# Patient Record
Sex: Female | Born: 1935 | Race: Asian | Hispanic: No | Smoking: Never smoker
Health system: Southern US, Community
[De-identification: ages and names within clinical notes are randomized; demographics above are authoritative.]

## PROBLEM LIST (undated history)

## (undated) DIAGNOSIS — F419 Anxiety disorder, unspecified: Secondary | ICD-10-CM

## (undated) DIAGNOSIS — K219 Gastro-esophageal reflux disease without esophagitis: Secondary | ICD-10-CM

## (undated) DIAGNOSIS — E7800 Pure hypercholesterolemia, unspecified: Secondary | ICD-10-CM

## (undated) DIAGNOSIS — E079 Disorder of thyroid, unspecified: Secondary | ICD-10-CM

## (undated) HISTORY — PX: KNEE ARTHROSCOPY: SUR90

## (undated) HISTORY — PX: ABDOMINAL HYSTERECTOMY: SHX81

---

## 2016-03-14 ENCOUNTER — Emergency Department (HOSPITAL_COMMUNITY)
Admission: EM | Admit: 2016-03-14 | Discharge: 2016-03-17 | Disposition: A | Discharge status: Other Acute Inpatient Hospital | Payer: Medicare Other | Attending: Emergency Medicine | Admitting: Emergency Medicine

## 2016-03-14 ENCOUNTER — Emergency Department (HOSPITAL_COMMUNITY): Payer: Medicare Other

## 2016-03-14 ENCOUNTER — Encounter (HOSPITAL_COMMUNITY): Payer: Self-pay

## 2016-03-14 DIAGNOSIS — F1110 Opioid abuse, uncomplicated: Secondary | ICD-10-CM | POA: Diagnosis not present

## 2016-03-14 DIAGNOSIS — Z8639 Personal history of other endocrine, nutritional and metabolic disease: Secondary | ICD-10-CM | POA: Insufficient documentation

## 2016-03-14 DIAGNOSIS — F4321 Adjustment disorder with depressed mood: Secondary | ICD-10-CM

## 2016-03-14 DIAGNOSIS — F4320 Adjustment disorder, unspecified: Secondary | ICD-10-CM | POA: Insufficient documentation

## 2016-03-14 DIAGNOSIS — Z8719 Personal history of other diseases of the digestive system: Secondary | ICD-10-CM | POA: Insufficient documentation

## 2016-03-14 DIAGNOSIS — F32A Depression, unspecified: Secondary | ICD-10-CM

## 2016-03-14 DIAGNOSIS — F329 Major depressive disorder, single episode, unspecified: Secondary | ICD-10-CM | POA: Insufficient documentation

## 2016-03-14 DIAGNOSIS — F419 Anxiety disorder, unspecified: Secondary | ICD-10-CM | POA: Diagnosis present

## 2016-03-14 HISTORY — DX: Disorder of thyroid, unspecified: E07.9

## 2016-03-14 HISTORY — DX: Gastro-esophageal reflux disease without esophagitis: K21.9

## 2016-03-14 HISTORY — DX: Pure hypercholesterolemia, unspecified: E78.00

## 2016-03-14 HISTORY — DX: Anxiety disorder, unspecified: F41.9

## 2016-03-14 LAB — COMPREHENSIVE METABOLIC PANEL
ALBUMIN: 3.5 g/dL (ref 3.5–5.0)
ALK PHOS: 44 U/L (ref 38–126)
ALT: 18 U/L (ref 14–54)
AST: 25 U/L (ref 15–41)
Anion gap: 11 (ref 5–15)
BUN: 13 mg/dL (ref 6–20)
CALCIUM: 8.8 mg/dL — AB (ref 8.9–10.3)
CO2: 22 mmol/L (ref 22–32)
Chloride: 100 mmol/L — ABNORMAL LOW (ref 101–111)
Creatinine, Ser: 0.7 mg/dL (ref 0.44–1.00)
GFR calc Af Amer: >60 mL/min (ref >60)
GFR calc non Af Amer: >60 mL/min (ref >60)
GLUCOSE: 119 mg/dL — AB (ref 65–99)
Potassium: 3.7 mmol/L (ref 3.5–5.1)
SODIUM: 133 mmol/L — AB (ref 135–145)
Total Bilirubin: 0.8 mg/dL (ref 0.3–1.2)
Total Protein: 6.7 g/dL (ref 6.5–8.1)

## 2016-03-14 LAB — RAPID URINE DRUG SCREEN, HOSP PERFORMED
Amphetamines: NOT DETECTED
Barbiturates: NOT DETECTED
Benzodiazepines: NOT DETECTED
COCAINE: NOT DETECTED
Opiates: POSITIVE — AB
Tetrahydrocannabinol: NOT DETECTED

## 2016-03-14 LAB — URINALYSIS, ROUTINE W REFLEX MICROSCOPIC
Bilirubin Urine: NEGATIVE
GLUCOSE, UA: NEGATIVE mg/dL
Hgb urine dipstick: NEGATIVE
KETONES UR: NEGATIVE mg/dL
Nitrite: NEGATIVE
PH: 6 (ref 5.0–8.0)
Protein, ur: NEGATIVE mg/dL
SPECIFIC GRAVITY, URINE: 1.008 (ref 1.005–1.030)

## 2016-03-14 LAB — CBC WITH DIFFERENTIAL/PLATELET
BASOS PCT: 0 %
Basophils Absolute: 0 10*3/uL (ref 0.0–0.1)
Eosinophils Absolute: 0 10*3/uL (ref 0.0–0.7)
Eosinophils Relative: 0 %
HEMATOCRIT: 33 % — AB (ref 36.0–46.0)
Hemoglobin: 11.1 g/dL — ABNORMAL LOW (ref 12.0–15.0)
Lymphocytes Relative: 30 %
Lymphs Abs: 1.4 10*3/uL (ref 0.7–4.0)
MCH: 33.3 pg (ref 26.0–34.0)
MCHC: 33.6 g/dL (ref 30.0–36.0)
MCV: 99.1 fL (ref 78.0–100.0)
MONO ABS: 0.3 10*3/uL (ref 0.1–1.0)
MONOS PCT: 7 %
NEUTROS ABS: 3 10*3/uL (ref 1.7–7.7)
Neutrophils Relative %: 63 %
Platelets: 266 10*3/uL (ref 150–400)
RBC: 3.33 MIL/uL — ABNORMAL LOW (ref 3.87–5.11)
RDW: 13.9 % (ref 11.5–15.5)
WBC: 4.8 10*3/uL (ref 4.0–10.5)

## 2016-03-14 LAB — I-STAT TROPONIN, ED: Troponin i, poc: 0.01 ng/mL (ref 0.00–0.08)

## 2016-03-14 LAB — URINE MICROSCOPIC-ADD ON: RBC / HPF: NONE SEEN RBC/hpf (ref 0–5)

## 2016-03-14 LAB — TSH: TSH: 9.393 u[IU]/mL — ABNORMAL HIGH (ref 0.350–4.500)

## 2016-03-14 MED ORDER — ZOLPIDEM TARTRATE 5 MG PO TABS
5.0000 mg | ORAL_TABLET | Freq: Every evening | ORAL | Status: DC | PRN
  Administered 2016-03-14 – 2016-03-16 (×3): 5 mg via ORAL
  Filled 2016-03-14 (×3): qty 1

## 2016-03-14 MED ORDER — LEVOTHYROXINE SODIUM 25 MCG PO TABS
25.0000 ug | ORAL_TABLET | Freq: Every day | ORAL | Status: DC
  Administered 2016-03-15 – 2016-03-17 (×3): 25 ug via ORAL
  Filled 2016-03-14 (×5): qty 1

## 2016-03-14 NOTE — ED Notes (Signed)
Patient here with grandaughter who reports that patient who lives alone has increased insomnia and anxiety since spouse died 5 months ago, currently lives alone

## 2016-03-14 NOTE — ED Notes (Signed)
Patient returned from X-ray 

## 2016-03-14 NOTE — BH Assessment (Addendum)
Tele Assessment Note   Deanna Cross is an 82 y.o. female. Pt presents accompanied by close supports [facility](Deanna Cross [phone]) & Deanna Cross [phone] (c), [phone] (h)). Pt consented verbally for exchange of information with supports.  Pt and supports report continued grief since pt's husband passed (12.2016). Pt has also been experiencing persistent episodes of intense fear, anxiety and what appears to be panic attacks from provided description. Pt reports no psychiatric history. Pt shared several times during interview that she wanted to die and did not want to live anymore. Pt expressed that she wished someone would kill her. Pt stated "If I had a gun I would shoot myself but I don't have one". Pt also shared thoughts of drowning herself. Pt denied intent.    Pt reported no HI or thoughts of harm towards other. Pt shared that she sometimes hears her husband's voice.   Pt reports Deanna NationsCindy Cross to be healthcare POA.  Diagnosis: F32.2 Major depressive disorder, single episode, severe w/ panic attacks   Past Medical History:  Past Medical History  Diagnosis Date  . Thyroid disease   . Anxiety   . High cholesterol   . GERD (gastroesophageal reflux disease)     History reviewed. No pertinent past surgical history.  Family History: No family history on file.  Social History:  reports that she has never smoked. She does not have any smokeless tobacco history on file. Her alcohol and drug histories are not on file.  Additional Social History:  Alcohol / Drug Use Pain Medications: See MAR Prescriptions: See MAR Over the Counter: None Reported History of alcohol / drug use?: No history of alcohol / drug abuse  CIWA: CIWA-Ar BP: 154/59 mmHg Pulse Rate: 71 COWS:    PATIENT STRENGTHS: (choose at least two) Average or above average intelligence Communication skills Supportive family/friends  Allergies: No Known Allergies  Home Medications:  (Not in a hospital  admission)  OB/GYN Status:  No LMP recorded.  General Assessment Data [facility] Assessment: In system Is this a Tele or Face-to-Face Assessment?: Tele Assessment Is this an Initial Assessment or a Re-assessment for this encounter?: Initial Assessment Marital status: Widowed Is patient pregnant?: No Pregnancy Status: No Living Arrangements: Alone Can pt return to current living arrangement?: Yes Admission Status: Voluntary Is patient capable of signing voluntary admission?: Yes Referral Source: Self/Family/Friend Insurance type: None     Crisis Care Plan Living Arrangements: Alone Name of Psychiatrist: None Name of Therapist: None  Education Status Is patient currently in school?: No Highest grade of school patient has completed: Not Reported  Risk to self with the past 6 months Suicidal Ideation: Yes-Currently Present Has patient been a risk to self within the past 6 months prior to admission? : Yes Suicidal Intent: No Has patient had any suicidal intent within the past 6 months prior to admission? : No Is patient at risk for suicide?: Yes Suicidal Plan?: Yes-Currently Present Has patient had any suicidal plan within the past 6 months prior to admission? : Yes Specify Current Suicidal Plan: "If I had a gun I could shoot myself", drown self Access to Means: Yes Specify Access to Suicidal Means: Access to water What has been your use of drugs/alcohol within the last 12 months?: None Reported Previous Attempts/Gestures: No Other Self Harm Risks: Grief Intentional Self Injurious Behavior: None Family Suicide History: No Recent stressful life event(s): Loss (Comment) (Husband passed 12/16) Persecutory voices/beliefs?: No Depression: Yes Depression Symptoms: Tearfulness, Insomnia, Despondent, Feeling worthless/self  pity, Loss of interest in usual pleasures Substance abuse history and/or treatment for substance abuse?: No Suicide prevention  information given to non-admitted patients: Not applicable  Risk to Others within the past 6 months Homicidal Ideation: No Does patient have any lifetime risk of violence toward others beyond the six months prior to admission? : No Thoughts of Harm to Others: No Current Homicidal Intent: No Current Homicidal Plan: No Access to Homicidal Means: No History of harm to others?: No Assessment of Violence: None Noted Does patient have access to weapons?: No Criminal Charges Pending?: No Does patient have a court date: No Is patient on probation?: No  Psychosis Hallucinations: None noted (reports hearing deceased husband's voice) Delusions: None noted  Mental Status Report Appearance/Hygiene: Unremarkable Eye Contact: Good Motor Activity: Unremarkable Speech: Logical/coherent Level of Consciousness: Alert Mood: Depressed Affect: Appropriate to circumstance, Depressed Anxiety Level: Moderate Thought Processes: Coherent, Relevant Judgement: Unimpaired Orientation: Person, Place, Time, Situation, Appropriate for developmental age Obsessive Compulsive Thoughts/Behaviors: None  Cognitive Functioning Concentration: Normal Memory: Recent Intact, Remote Intact IQ: Average Insight: Good Impulse Control: Good Appetite: Poor Weight Loss:  ("I lost a little bit") Weight Gain: 0 Sleep: Decreased Total Hours of Sleep:  (Pt reports unable to sleep) Vegetative Symptoms: None  ADLScreening [facility] Assessment Services) Patient's cognitive ability adequate to safely complete daily activities?: Yes Patient able to express need for assistance with ADLs?: Yes Independently performs ADLs?: No  Prior Inpatient Therapy Prior Inpatient Therapy: No  Prior Outpatient Therapy Prior Outpatient Therapy: No Does patient have an ACCT team?: No Does patient have Intensive In-House Services?  : No Does patient have [facility] services? : No Does patient have P4CC services?: No  ADL Screening (condition  at time of admission) Patient's cognitive ability adequate to safely complete daily activities?: Yes Is the patient deaf or have difficulty hearing?: No Does the patient have difficulty seeing, even when wearing glasses/contacts?: No Does the patient have difficulty concentrating, remembering, or making decisions?: Yes Patient able to express need for assistance with ADLs?: Yes Does the patient have difficulty dressing or bathing?: No Independently performs ADLs?: No Communication: Independent Dressing (OT): Independent Grooming: Independent Feeding: Independent Bathing: Independent with device (comment) Public relations account executive) Toileting: Independent with device (comment) (Grab bars nstalled in home bathroom ) In/Out Bed: Independent Walks in Home: Independent with device (comment) (Utilizes Walker) Does the patient have difficulty walking or climbing stairs?: Yes (Utilizes walker & hand rails ) Weakness of Legs: None Weakness of Arms/Hands: None  Home Assistive Devices/Equipment Home Assistive Devices/Equipment: Raised toilet seat with rails, Walker (specify type), Grab bars around toilet, Eyeglasses  Therapy Consults (therapy consults require a physician order) PT Evaluation Needed: No OT Evalulation Needed: No SLP Evaluation Needed: No Abuse/Neglect Assessment (Assessment to be complete while patient is alone) Physical Abuse: Denies Verbal Abuse: Denies Sexual Abuse: Denies Exploitation of patient/patient's resources: Denies Self-Neglect: Denies Values / Beliefs Cultural Requests During Hospitalization: None Spiritual Requests During Hospitalization: None Consults Spiritual Care Consult Needed: No Social Work Consult Needed: No Merchant navy officer (For Healthcare) Does patient have an advance directive?: No Would patient like information on creating an advanced directive?: No - patient declined information    Additional Information 1:1 In Past 12 Months?: No CIRT Risk:  No Elopement Risk: No Does patient have medical clearance?: No     Disposition: Per Maryjean Morn, PA pt meets criteria for inpatient admission and is to be referred to gero-psych. TTS to seek placement. Pt RN Lequita Halt) has been informed of pt  disposition.  Disposition Initial Assessment Completed for this Encounter: Yes Disposition of Patient: Other dispositions Other disposition(s): Other (Comment) (Pending Recommendation)  Kye Silverstein J [place] 03/14/2016 9:27 PM

## 2016-03-14 NOTE — ED Notes (Signed)
MD Ray at bedside, notified of patients remark about if she had a gun she would shoot herself.

## 2016-03-14 NOTE — ED Notes (Signed)
Pt placed in paper scurbs and wanded.

## 2016-03-14 NOTE — ED Notes (Signed)
TTS complete 

## 2016-03-14 NOTE — ED Provider Notes (Signed)
CSN: [id number]     Arrival date & time 03/14/16  1534 History   First MD Initiated Contact with Patient 03/14/16 1714     Chief Complaint  Patient presents with  . Anxiety  . sleep disturbances      (Consider location/radiation/quality/duration/timing/severity/associated sxs/prior Treatment) HPI 82 y.o. Female with no significant  pmh presents today with increasing inability to sleep, anxiety, depression with SI since husband died last 2023-10-28.  Patient lives alone.  She has friends that check on her.  She has seen her pmd in [place] twice in past two weeks for same and was given ambien, then continued unable to sleep so started citalopram.  Continues depressed and worsening states when asked if she would harm self, "I would shoot myself if I had a gun."  Past Medical History  Diagnosis Date  . Thyroid disease   . Anxiety   . High cholesterol   . GERD (gastroesophageal reflux disease)    History reviewed. No pertinent past surgical history. No family history on file. Social History  Substance Use Topics  . Smoking status: Never Smoker   . Smokeless tobacco: None  . Alcohol Use: None   OB History    No data available     Review of Systems  All other systems reviewed and are negative.     Allergies  Review of patient's allergies indicates no known allergies.  Home Medications   Prior to Admission medications   Not on File   BP 200/68 mmHg  Pulse 76  Temp(Src) 98 F (36.7 C) (Oral)  Resp 16  Ht 5' (1.524 m)  Wt 44.651 kg  BMI 19.22 kg/m2  SpO2 98% Physical Exam  Constitutional: She is oriented to person, place, and time. She appears well-developed and well-nourished. No distress.  HENT:  Head: Normocephalic and atraumatic.  Right Ear: External ear normal.  Left Ear: External ear normal.  Nose: Nose normal.  Mouth/Throat: Oropharynx is clear and moist.  Eyes: Conjunctivae are normal. Pupils are equal, round, and reactive to light.  Neck: Normal range of  motion. Neck supple.  Cardiovascular: Normal rate, regular rhythm, normal heart sounds and intact distal pulses.   Pulmonary/Chest: Effort normal and breath sounds normal.  Abdominal: Soft. Bowel sounds are normal.  Musculoskeletal: Normal range of motion. She exhibits no edema.  Neurological: She is alert and oriented to person, place, and time. She has normal reflexes. She displays normal reflexes. No cranial nerve deficit. She exhibits normal muscle tone. Coordination normal.  Skin: Skin is warm and dry.  Psychiatric: She has a normal mood and affect.  Nursing note and vitals reviewed.   ED Course  Procedures (including critical care time) Labs Review Labs Reviewed  COMPREHENSIVE METABOLIC PANEL - Abnormal; Notable for the following:    Sodium 133 (*)    Chloride 100 (*)    Glucose, Bld 119 (*)    Calcium 8.8 (*)    All other components within normal limits  CBC WITH DIFFERENTIAL/PLATELET - Abnormal; Notable for the following:    RBC 3.33 (*)    Hemoglobin 11.1 (*)    HCT 33.0 (*)    All other components within normal limits  TSH  URINALYSIS, ROUTINE W REFLEX MICROSCOPIC (NOT AT [facility])  URINE RAPID DRUG SCREEN, HOSP PERFORMED  I-STAT TROPOININ, ED    Imaging Review No results found. I have personally reviewed and evaluated these images and lab results as part of my medical decision-making.   EKG Interpretation   Date/Time:  Saturday Mar 14 2016 15:50:45 EDT Ventricular Rate:  74 PR Interval:  156 QRS Duration: 76 QT Interval:  452 QTC Calculation: 501 R Axis:   -9 Text Interpretation:  Normal sinus rhythm Anterior infarct , age  undetermined Abnormal ECG Confirmed by Wai Litt MD, Duwayne HeckANIELLE [phone]([id number]) on  03/14/2016 5:33:54 PM      MDM   Final diagnoses:  Depression  Grief reaction    Elevated tsh-would plan to start synthroid at 50 mcg and follow up with primary care in two weeks. Mild anemia-f/u 9:46 PM tts evaluation pending  11:43 PM tts states patient to be  admitted to geriatric psych  Margarita Grizzleanielle Tyshawn Ciullo, MD 03/14/16 2344

## 2016-03-14 NOTE — ED Notes (Signed)
Pt belongings sent with family, pt kept glasses, dentures and walker all within reach of pt.

## 2016-03-15 MED ORDER — RALOXIFENE HCL 60 MG PO TABS
60.0000 mg | ORAL_TABLET | Freq: Every day | ORAL | Status: DC
  Administered 2016-03-15 – 2016-03-16 (×2): 60 mg via ORAL
  Filled 2016-03-15 (×4): qty 1

## 2016-03-15 MED ORDER — CALCIUM CARB-CHOLECALCIFEROL 600-800 MG-UNIT PO TABS: 1.0000 | ORAL_TABLET | Freq: Every day | ORAL | Status: DC

## 2016-03-15 MED ORDER — MOMETASONE FURO-FORMOTEROL FUM 200-5 MCG/ACT IN AERO
2.0000 | INHALATION_SPRAY | Freq: Two times a day (BID) | RESPIRATORY_TRACT | Status: DC
  Administered 2016-03-15 – 2016-03-17 (×4): 2 via RESPIRATORY_TRACT
  Filled 2016-03-15: qty 8.8

## 2016-03-15 MED ORDER — CITALOPRAM HYDROBROMIDE 10 MG PO TABS
20.0000 mg | ORAL_TABLET | Freq: Every day | ORAL | Status: DC
  Administered 2016-03-15 – 2016-03-16 (×2): 20 mg via ORAL
  Filled 2016-03-15 (×2): qty 2

## 2016-03-15 MED ORDER — PRAVASTATIN SODIUM 40 MG PO TABS
20.0000 mg | ORAL_TABLET | Freq: Every evening | ORAL | Status: DC
Start: 2016-03-15 — End: 2016-03-17
  Administered 2016-03-15 – 2016-03-16 (×2): 20 mg via ORAL
  Filled 2016-03-15 (×2): qty 1

## 2016-03-15 MED ORDER — CALCIUM CARBONATE-VITAMIN D 500-200 MG-UNIT PO TABS
1.0000 | ORAL_TABLET | Freq: Every day | ORAL | Status: DC
  Administered 2016-03-15 – 2016-03-17 (×3): 1 via ORAL
  Filled 2016-03-15 (×4): qty 1

## 2016-03-15 MED ORDER — ASPIRIN EC 81 MG PO TBEC
81.0000 mg | DELAYED_RELEASE_TABLET | Freq: Every day | ORAL | Status: DC
Start: 2016-03-15 — End: 2016-03-17
  Administered 2016-03-15 – 2016-03-16 (×2): 81 mg via ORAL
  Filled 2016-03-15: qty 1

## 2016-03-15 MED ORDER — DOCUSATE SODIUM 100 MG PO CAPS
100.0000 mg | ORAL_CAPSULE | Freq: Every morning | ORAL | Status: DC
  Administered 2016-03-15 – 2016-03-16 (×2): 100 mg via ORAL
  Filled 2016-03-15 (×2): qty 1

## 2016-03-15 MED ORDER — FE FUMARATE-B12-VIT C-FA-IFC PO CAPS
1.0000 | ORAL_CAPSULE | Freq: Every morning | ORAL | Status: DC
Start: 2016-03-15 — End: 2016-03-17
  Administered 2016-03-15 – 2016-03-16 (×2): 1 via ORAL
  Filled 2016-03-15 (×4): qty 1

## 2016-03-15 MED ORDER — PANTOPRAZOLE SODIUM 40 MG PO TBEC
40.0000 mg | DELAYED_RELEASE_TABLET | Freq: Every day | ORAL | Status: DC
  Administered 2016-03-15 – 2016-03-16 (×2): 40 mg via ORAL
  Filled 2016-03-15 (×2): qty 1

## 2016-03-15 MED ORDER — ZOLPIDEM TARTRATE 5 MG PO TABS
5.0000 mg | ORAL_TABLET | Freq: Once | ORAL | Status: AC
  Administered 2016-03-15: 5 mg via ORAL
  Filled 2016-03-15: qty 1

## 2016-03-15 NOTE — ED Notes (Signed)
Pt to shower - [phone] - sitter escorting via w/c.

## 2016-03-15 NOTE — BH Assessment (Signed)
[facility]

## 2016-03-15 NOTE — BH Assessment (Addendum)
Referral information for Geriatric Placement has been faxed to;    [facility]   [place]   [place]   [address]   [facility]

## 2016-03-15 NOTE — ED Notes (Signed)
Pt asked if she could have anything more to help her sleep, as the previous dose of ambien seems to have worn off now. Clinton SawyerNotified Campos, MD.

## 2016-03-15 NOTE — ED Notes (Signed)
[facility] called and advised pt is being placed on their waitlist.

## 2016-03-15 NOTE — ED Notes (Signed)
Pt wearing her eyeglasses. Walker in room.

## 2016-03-15 NOTE — ED Notes (Signed)
Kim from [facility] called to advise they received referral for pt.

## 2016-03-15 NOTE — ED Notes (Signed)
Pt's friends visiting x 3. Pt states she has no family left.

## 2016-03-15 NOTE — ED Notes (Signed)
Pt declined snack.  

## 2016-03-15 NOTE — ED Notes (Signed)
Pt's friends are leaving now. Requested to be notified when placement for pt is found.

## 2016-03-15 NOTE — ED Notes (Signed)
Pt did not want snack.

## 2016-03-15 NOTE — ED Notes (Signed)
Friends x 2 visiting w/pt.

## 2016-03-16 NOTE — ED Notes (Signed)
Pt asking fpr sleeping med

## 2016-03-16 NOTE — BH Assessment (Signed)
[facility] Assessment Progress [facility], Pt has been accepted by Dr. Zonia KiefStephens to bed 9441, and call report # is [id number]. The bed is ready any time, but pt must be IVC'sd of arrive with secure transport.

## 2016-03-16 NOTE — ED Notes (Signed)
Patient was given a snack and drink, and a regular diet ordered for lunch. 

## 2016-03-16 NOTE — Progress Notes (Signed)
Followed up on inpatient geriatric-psych referrals.  Left voicemails with [facility] and [place].   Pt under [facility] at: [facility]- possible [facility] later today [facility]- per Boneta LucksJenny, no beds today but taking referrals for expected beds tomorrow [facility]- per [place] Anar[facility], one possible geriatric bed opening after 5pm today [facility]- per Denny PeonErin (usually requires dementia dx for program but advised pt may be appropriate due to other presenting symptoms)  [facility]. [facility] at capacity, however they note that dx of dementia/Alzhiemer's is required for admission therefore advise would not be accepted.  Could not reach [facility].  Ilean SkillMeghan Jack Mineau, MSW, LCSW Clinical Social Work, Disposition  03/16/2016 [phone]

## 2016-03-16 NOTE — ED Notes (Signed)
Calm comfortable

## 2016-03-16 NOTE — ED Notes (Signed)
Visitors at the bedside.

## 2016-03-16 NOTE — Progress Notes (Signed)
Spoke with D[facility]Erin [facility]- clarified that pt does not need to be IVC'd for admission if pt is willing to be transported voluntarily and sign self in upon admission. Informed [facility].   Ilean SkillMeghan Renard Caperton, MSW, LCSW Clinical Social Work, Disposition  03/16/2016 [phone]

## 2016-03-16 NOTE — ED Notes (Signed)
The pt wants to be taken outside  She cannot [place]  Discussed with the pt

## 2016-03-17 NOTE — Progress Notes (Signed)
[facility] called to check on pt's status. Writer informed her pt is being prepared for voluntary transfer.

## 2016-03-17 NOTE — ED Notes (Signed)
[facility] and is in agreement w/plan. Requested for RN to notify her friends - [place].

## 2016-03-17 NOTE — ED Notes (Signed)
Left messages for the [facility] to return call.

## 2016-03-17 NOTE — ED Provider Notes (Signed)
Patient is evaluated for EMTALA (09:25). [facility] is [facility] by Dr. Andria MeuseStevens. The patient is alert and nontoxic. She has no complaints point. She has eaten a normal breakfast. Idol signs are stable. Heart is regular without rub murmur gallop. Lungs are clear without wheeze rhonchi rail. Abdomen is soft and nontender. Patient does not have peripheral edema. Her skin is warm and dry. She is alert and appropriate with normal conversation and following commands appropriately. Patient is assessed to be stable for transfer to further psychiatric management.  Arby BarretteMarcy Rylyn Zawistowski, MD 03/17/16 [phone]

## 2016-04-16 ENCOUNTER — Emergency Department (HOSPITAL_COMMUNITY): Payer: Medicare Other

## 2016-04-16 ENCOUNTER — Observation Stay (HOSPITAL_COMMUNITY)
Admission: EM | Admit: 2016-04-16 | Discharge: 2016-04-17 | Disposition: A | Discharge status: Skilled Nursing Facility | Payer: Medicare Other | Attending: Internal Medicine | Admitting: Internal Medicine

## 2016-04-16 ENCOUNTER — Encounter (HOSPITAL_COMMUNITY): Payer: Self-pay | Admitting: Emergency Medicine

## 2016-04-16 ENCOUNTER — Observation Stay (HOSPITAL_COMMUNITY): Payer: Medicare Other

## 2016-04-16 DIAGNOSIS — F329 Major depressive disorder, single episode, unspecified: Secondary | ICD-10-CM | POA: Insufficient documentation

## 2016-04-16 DIAGNOSIS — I639 Cerebral infarction, unspecified: Secondary | ICD-10-CM

## 2016-04-16 DIAGNOSIS — I672 Cerebral atherosclerosis: Secondary | ICD-10-CM | POA: Diagnosis not present

## 2016-04-16 DIAGNOSIS — Z8673 Personal history of transient ischemic attack (TIA), and cerebral infarction without residual deficits: Secondary | ICD-10-CM

## 2016-04-16 DIAGNOSIS — I951 Orthostatic hypotension: Secondary | ICD-10-CM | POA: Diagnosis not present

## 2016-04-16 DIAGNOSIS — N183 Chronic kidney disease, stage 3 (moderate): Secondary | ICD-10-CM | POA: Insufficient documentation

## 2016-04-16 DIAGNOSIS — I69349 Monoplegia of lower limb following cerebral infarction affecting unspecified side: Secondary | ICD-10-CM | POA: Diagnosis not present

## 2016-04-16 DIAGNOSIS — D649 Anemia, unspecified: Secondary | ICD-10-CM | POA: Diagnosis not present

## 2016-04-16 DIAGNOSIS — R29898 Other symptoms and signs involving the musculoskeletal system: Secondary | ICD-10-CM

## 2016-04-16 DIAGNOSIS — R531 Weakness: Secondary | ICD-10-CM | POA: Diagnosis not present

## 2016-04-16 DIAGNOSIS — E7800 Pure hypercholesterolemia, unspecified: Secondary | ICD-10-CM | POA: Insufficient documentation

## 2016-04-16 DIAGNOSIS — I10 Essential (primary) hypertension: Secondary | ICD-10-CM | POA: Insufficient documentation

## 2016-04-16 DIAGNOSIS — G4700 Insomnia, unspecified: Secondary | ICD-10-CM | POA: Diagnosis not present

## 2016-04-16 DIAGNOSIS — K219 Gastro-esophageal reflux disease without esophagitis: Secondary | ICD-10-CM | POA: Diagnosis not present

## 2016-04-16 DIAGNOSIS — Z7982 Long term (current) use of aspirin: Secondary | ICD-10-CM | POA: Diagnosis not present

## 2016-04-16 DIAGNOSIS — R262 Difficulty in walking, not elsewhere classified: Secondary | ICD-10-CM | POA: Diagnosis not present

## 2016-04-16 DIAGNOSIS — E039 Hypothyroidism, unspecified: Secondary | ICD-10-CM

## 2016-04-16 DIAGNOSIS — N179 Acute kidney failure, unspecified: Secondary | ICD-10-CM | POA: Diagnosis not present

## 2016-04-16 DIAGNOSIS — G459 Transient cerebral ischemic attack, unspecified: Secondary | ICD-10-CM | POA: Insufficient documentation

## 2016-04-16 DIAGNOSIS — I129 Hypertensive chronic kidney disease with stage 1 through stage 4 chronic kidney disease, or unspecified chronic kidney disease: Secondary | ICD-10-CM | POA: Diagnosis not present

## 2016-04-16 DIAGNOSIS — J069 Acute upper respiratory infection, unspecified: Secondary | ICD-10-CM

## 2016-04-16 DIAGNOSIS — F419 Anxiety disorder, unspecified: Secondary | ICD-10-CM | POA: Diagnosis not present

## 2016-04-16 LAB — RAPID URINE DRUG SCREEN, HOSP PERFORMED
AMPHETAMINES: NOT DETECTED
BARBITURATES: NOT DETECTED
BENZODIAZEPINES: NOT DETECTED
COCAINE: NOT DETECTED
Opiates: POSITIVE — AB
Tetrahydrocannabinol: NOT DETECTED

## 2016-04-16 LAB — URINALYSIS, ROUTINE W REFLEX MICROSCOPIC
BILIRUBIN URINE: NEGATIVE
GLUCOSE, UA: NEGATIVE mg/dL
HGB URINE DIPSTICK: NEGATIVE
Ketones, ur: NEGATIVE mg/dL
Leukocytes, UA: NEGATIVE
Nitrite: NEGATIVE
PROTEIN: NEGATIVE mg/dL
Specific Gravity, Urine: 1.013 (ref 1.005–1.030)
pH: 7.5 (ref 5.0–8.0)

## 2016-04-16 LAB — COMPREHENSIVE METABOLIC PANEL
ALK PHOS: 38 U/L (ref 38–126)
ALT: 16 U/L (ref 14–54)
AST: 20 U/L (ref 15–41)
Albumin: 3.2 g/dL — ABNORMAL LOW (ref 3.5–5.0)
Anion gap: 9 (ref 5–15)
BILIRUBIN TOTAL: 0.7 mg/dL (ref 0.3–1.2)
BUN: 19 mg/dL (ref 6–20)
CALCIUM: 8.7 mg/dL — AB (ref 8.9–10.3)
CO2: 25 mmol/L (ref 22–32)
CREATININE: 1.02 mg/dL — AB (ref 0.44–1.00)
Chloride: 101 mmol/L (ref 101–111)
GFR calc non Af Amer: 48 mL/min — ABNORMAL LOW (ref >60)
GFR, EST AFRICAN AMERICAN: 55 mL/min — AB (ref >60)
Glucose, Bld: 91 mg/dL (ref 65–99)
Potassium: 3.6 mmol/L (ref 3.5–5.1)
SODIUM: 135 mmol/L (ref 135–145)
TOTAL PROTEIN: 6.3 g/dL — AB (ref 6.5–8.1)

## 2016-04-16 LAB — I-STAT CHEM 8, ED
BUN: 22 mg/dL — AB (ref 6–20)
CALCIUM ION: 1.08 mmol/L — AB (ref 1.13–1.30)
CHLORIDE: 99 mmol/L — AB (ref 101–111)
CREATININE: 1 mg/dL (ref 0.44–1.00)
GLUCOSE: 86 mg/dL (ref 65–99)
HCT: 32 % — ABNORMAL LOW (ref 36.0–46.0)
Hemoglobin: 10.9 g/dL — ABNORMAL LOW (ref 12.0–15.0)
Potassium: 3.6 mmol/L (ref 3.5–5.1)
SODIUM: 135 mmol/L (ref 135–145)
TCO2: 25 mmol/L (ref 0–100)

## 2016-04-16 LAB — I-STAT TROPONIN, ED: Troponin i, poc: 0.01 ng/mL (ref 0.00–0.08)

## 2016-04-16 LAB — ETHANOL: Alcohol, Ethyl (B): <5 mg/dL (ref <5)

## 2016-04-16 LAB — DIFFERENTIAL
Basophils Absolute: 0 10*3/uL (ref 0.0–0.1)
Basophils Relative: 0 %
EOS PCT: 0 %
Eosinophils Absolute: 0 10*3/uL (ref 0.0–0.7)
LYMPHS ABS: 1.4 10*3/uL (ref 0.7–4.0)
LYMPHS PCT: 19 %
MONOS PCT: 7 %
Monocytes Absolute: 0.5 10*3/uL (ref 0.1–1.0)
NEUTROS ABS: 5.4 10*3/uL (ref 1.7–7.7)
Neutrophils Relative %: 74 %

## 2016-04-16 LAB — PROTIME-INR
INR: 1.2 (ref 0.00–1.49)
Prothrombin Time: 15.4 seconds — ABNORMAL HIGH (ref 11.6–15.2)

## 2016-04-16 LAB — CBC
HEMATOCRIT: 31.1 % — AB (ref 36.0–46.0)
HEMOGLOBIN: 10.3 g/dL — AB (ref 12.0–15.0)
MCH: 32 pg (ref 26.0–34.0)
MCHC: 33.1 g/dL (ref 30.0–36.0)
MCV: 96.6 fL (ref 78.0–100.0)
PLATELETS: 327 10*3/uL (ref 150–400)
RBC: 3.22 MIL/uL — AB (ref 3.87–5.11)
RDW: 13.6 % (ref 11.5–15.5)
WBC: 7.4 10*3/uL (ref 4.0–10.5)

## 2016-04-16 LAB — TSH: TSH: 9.139 u[IU]/mL — ABNORMAL HIGH (ref 0.350–4.500)

## 2016-04-16 LAB — SODIUM, URINE, RANDOM: Sodium, Ur: 126 mmol/L

## 2016-04-16 LAB — CREATININE, URINE, RANDOM: CREATININE, URINE: 39.68 mg/dL

## 2016-04-16 LAB — VITAMIN B12: Vitamin B-12: 910 pg/mL (ref 180–914)

## 2016-04-16 LAB — FERRITIN: Ferritin: 667 ng/mL — ABNORMAL HIGH (ref 11–307)

## 2016-04-16 LAB — APTT: aPTT: 25 seconds (ref 24–37)

## 2016-04-16 MED ORDER — ALBUTEROL SULFATE (2.5 MG/3ML) 0.083% IN NEBU: 2.5000 mg | INHALATION_SOLUTION | Freq: Four times a day (QID) | RESPIRATORY_TRACT | Status: DC | PRN

## 2016-04-16 MED ORDER — MOMETASONE FURO-FORMOTEROL FUM 200-5 MCG/ACT IN AERO
2.0000 | INHALATION_SPRAY | Freq: Two times a day (BID) | RESPIRATORY_TRACT | Status: DC
  Filled 2016-04-16: qty 8.8

## 2016-04-16 MED ORDER — CITALOPRAM HYDROBROMIDE 10 MG PO TABS
20.0000 mg | ORAL_TABLET | Freq: Every day | ORAL | Status: DC
  Administered 2016-04-17: 20 mg via ORAL
  Filled 2016-04-16: qty 2

## 2016-04-16 MED ORDER — LEVOTHYROXINE SODIUM 75 MCG PO TABS
75.0000 ug | ORAL_TABLET | Freq: Every day | ORAL | Status: DC
  Administered 2016-04-17: 75 ug via ORAL
  Filled 2016-04-16: qty 1

## 2016-04-16 MED ORDER — SODIUM CHLORIDE 0.9 % IV SOLN
INTRAVENOUS | Status: AC
  Administered 2016-04-16: 20:00:00 via INTRAVENOUS

## 2016-04-16 MED ORDER — ACETAMINOPHEN 650 MG RE SUPP: 650.0000 mg | Freq: Four times a day (QID) | RECTAL | Status: DC | PRN

## 2016-04-16 MED ORDER — ACETAMINOPHEN 325 MG PO TABS: 650.0000 mg | ORAL_TABLET | Freq: Four times a day (QID) | ORAL | Status: DC | PRN

## 2016-04-16 MED ORDER — DOCUSATE SODIUM 100 MG PO CAPS
100.0000 mg | ORAL_CAPSULE | Freq: Every morning | ORAL | Status: DC
  Administered 2016-04-17: 100 mg via ORAL
  Filled 2016-04-16: qty 1

## 2016-04-16 MED ORDER — ENOXAPARIN SODIUM 30 MG/0.3ML ~~LOC~~ SOLN
30.0000 mg | SUBCUTANEOUS | Status: DC
  Administered 2016-04-17: 30 mg via SUBCUTANEOUS
  Filled 2016-04-16 (×2): qty 0.3

## 2016-04-16 MED ORDER — ASPIRIN EC 81 MG PO TBEC
81.0000 mg | DELAYED_RELEASE_TABLET | Freq: Every day | ORAL | Status: DC
  Administered 2016-04-17: 81 mg via ORAL
  Filled 2016-04-16: qty 1

## 2016-04-16 MED ORDER — ENOXAPARIN SODIUM 40 MG/0.4ML ~~LOC~~ SOLN: 40.0000 mg | SUBCUTANEOUS | Status: DC

## 2016-04-16 MED ORDER — ASPIRIN 81 MG PO CHEW
CHEWABLE_TABLET | ORAL | Status: AC
  Administered 2016-04-16: 81 mg
  Filled 2016-04-16: qty 1

## 2016-04-16 MED ORDER — TRAZODONE HCL 50 MG PO TABS
50.0000 mg | ORAL_TABLET | Freq: Every evening | ORAL | Status: DC | PRN
  Administered 2016-04-16: 50 mg via ORAL
  Filled 2016-04-16: qty 1

## 2016-04-16 MED ORDER — SODIUM CHLORIDE 0.9 % IV BOLUS (SEPSIS)
1000.0000 mL | Freq: Once | INTRAVENOUS | Status: AC
  Administered 2016-04-16: 1000 mL via INTRAVENOUS

## 2016-04-16 MED ORDER — ALBUTEROL SULFATE HFA 108 (90 BASE) MCG/ACT IN AERS: 2.0000 | INHALATION_SPRAY | Freq: Four times a day (QID) | RESPIRATORY_TRACT | Status: DC | PRN

## 2016-04-16 NOTE — ED Notes (Signed)
Attempted report 

## 2016-04-16 NOTE — ED Notes (Addendum)
PT brought by EMS from home for speech changes. Family was concerned that PT may be experiencing stroke symptoms. PT was last seen 2 days ago by family and was normal at that time. Family and EMS describe speech changes as "stuttering". EMS describes speech as repetitive upon arrival. During transport speech improved. PT has had a previous stroke with lingering right sided deficits. When asked what brought PT in today she states, "I sat down in my chair and could not get up. I want to know why that happened." PT reports she experienced general weakness at that time, not unilateral weakness. PT also states, "I had a hard time getting into bed last night, but I made it."

## 2016-04-16 NOTE — ED Provider Notes (Signed)
CSN: [id number]     Arrival date & time 04/16/16  1006 History   First MD Initiated Contact with Patient 04/16/16 1026     Chief Complaint  Patient presents with  . Transient Ischemic Attack     (Consider location/radiation/quality/duration/timing/severity/associated sxs/prior Treatment) The history is provided by the patient.  Patient is a 82 yo F with a PMHx of stroke, hyothyroidism, anxiety, HLD, and GERD presenting to the ED by EMS from home for speech changes. As per EMS and family, patient's speech was "stuttering." EMS describes the speech as repetitive upon arrival. Speech improved during transport. Patient states she was feeling weak yesterday and having some difficulty getting up from her chair. States she was [facility] a week ago for cold symptoms and SOB which have since resolved. States this morning her symptoms started at around 6 am when she was feeling lightheaded. Patient states "I could not get out the chair because my whole body felt weak." She lives independently and called her friend to tell him about her symptoms. States she was having difficulty with her speech when talking on the phone as "it was all over." States her vision changed - "I looked at the pictueres on the wall and they were moving."  Reports having right leg weakness from prior stroke 5-6 years ago and right knee surgery. Reports taking a baby Aspirin everyday. Denies any history of seizures.     Past Medical History  Diagnosis Date  . Thyroid disease   . Anxiety   . High cholesterol   . GERD (gastroesophageal reflux disease)    Past Surgical History  Procedure Laterality Date  . Abdominal hysterectomy     No family history on file. Social History  Substance Use Topics  . Smoking status: Never Smoker   . Smokeless tobacco: None  . Alcohol Use: No   OB History    No data available     Review of Systems  Constitutional: Negative for fever and chills.  HENT: Negative for rhinorrhea  and sore throat.   Eyes: Positive for visual disturbance. Negative for pain.  Respiratory: Negative for cough, shortness of breath and wheezing.   Cardiovascular: Negative for chest pain and leg swelling.  Gastrointestinal: Negative for nausea, vomiting and abdominal pain.  Genitourinary: Negative for dysuria and flank pain.  Musculoskeletal: Negative for myalgias and neck pain.  Skin: Negative for pallor and rash.  Neurological: Positive for speech difficulty, weakness and light-headedness.      Allergies  Accolate; Aleve; Benadryl; Ibuprofen; Kenalog; Macrolides and ketolides; and Rocephin  Home Medications   Prior to Admission medications   Medication Sig Start Date End Date Taking? Authorizing Provider  ADVAIR DISKUS 250-50 MCG/DOSE AEPB Inhale 2 puffs into the lungs 2 (two) times daily. 03/14/16   Historical Provider, MD  aspirin EC 81 MG tablet Take 81 mg by mouth daily.    Historical Provider, MD  Calcium Carb-Cholecalciferol (CALTRATE 600+D) 600-800 MG-UNIT TABS Take 1 tablet by mouth daily.    Historical Provider, MD  citalopram (CELEXA) 20 MG tablet Take 20 mg by mouth daily. 03/12/16   Historical Provider, MD  docusate sodium (COLACE) 100 MG capsule Take 100 mg by mouth every morning.     Historical Provider, MD  levothyroxine (SYNTHROID, LEVOTHROID) 75 MCG tablet Take 75 mcg by mouth daily before breakfast. 02/17/16   Historical Provider, MD  mineral/vitamin supplement (MULTIGEN) 70 MG TABS tablet Take 1 tablet by mouth daily. 03/07/16   Historical Provider, MD  omeprazole (PRILOSEC) 20 MG capsule Take 20 mg by mouth 2 (two) times daily.  02/17/16   Historical Provider, MD  pravastatin (PRAVACHOL) 20 MG tablet Take 20 mg by mouth every evening. 02/17/16   Historical Provider, MD  raloxifene (EVISTA) 60 MG tablet Take 60 mg by mouth daily.    Historical Provider, MD  zolpidem (AMBIEN) 5 MG tablet Take 5 mg by mouth at bedtime.     Historical Provider, MD   BP 166/64 mmHg  Pulse 85   Temp(Src) 98.7 F (37.1 C) (Oral)  Resp 16  Ht 5\' 3"  (1.6 m)  Wt 48.081 kg  BMI 18.78 kg/m2  SpO2 100% Physical Exam  Constitutional: She is oriented to person, place, and time. She appears well-developed and well-nourished. No distress.  HENT:  Head: Normocephalic and atraumatic.  Mouth/Throat: Oropharynx is clear and moist.  Eyes: EOM are normal. Pupils are equal, round, and reactive to light.  Neck: Neck supple. No tracheal deviation present.  Cardiovascular: Normal rate, regular rhythm and intact distal pulses.  Exam reveals no gallop and no friction rub.   No murmur heard. Pulmonary/Chest: Effort normal and breath sounds normal. No respiratory distress. She has no wheezes. She has no rales.  Abdominal: Soft. Bowel sounds are normal. She exhibits no distension. There is no tenderness. There is no guarding.  Musculoskeletal: She exhibits no edema.  Neurological: She is alert and oriented to person, place, and time. No cranial nerve deficit.  Strength: 5/5 in bilateral upper extremities. 4/5 in RLE and 5/5 in LLE. Sensation to light touch intact throughout. No dysdiadochokinesia. Coordination normal.   Skin: Skin is warm and dry. No rash noted. No erythema.  Psychiatric: She has a normal mood and affect. Her behavior is normal.    ED Course  Procedures (including critical care time) Labs Review Labs Reviewed - No data to display  Imaging Review No results found. I have personally reviewed and evaluated these images and lab results as part of my medical decision-making.   EKG Interpretation None      MDM   Final diagnoses:  None   Generalized weakness Patient is presenting with acute onset, transient weakness in her entire body, lightheadedness, speech changes, and vision changes which have resolved. She has a history of prior stroke and residual RLE weakness. UDS positive for opiates. Ethanol negative. Troponin negative. CT of head showing advanced atrophy and mild  microvascular ischemic disease without acute intracranial process. MRI of head showing no acute infarct or intracranial hemorrhage. It is showing a remote small mid right coronal radiata infarct. Remote tiny right cerebellar infarct. Mild to slightly moderate chronic microvascular changes and mild global atrophy without hydrocephalus. MRA of head was motion degraded which limits evaluation for intracranial atherosclerotic changes and for detecting aneurysm. There is flow within portions of the internal carotid arteries, vertebral arteries, and basilar artery. Findings are suspicious for intracranial and atherosclerotic changes with medium and small vessel stenosis.  -Neurology has been consulted. Spoke to Dr. Amada JupiterKirkpatrick, he does not believe patient's symptoms are neurologic in etiology. However, he is recommending overnight inpatient observation for her generalized weakness.  -Spoke to [facility] and they are admitting the patient for observation under their service.    AKI SCr 1.02 today, was 0.7 on 03/14/16. Likely due to dehydration. -1L bolus of NS     John GiovanniVasundhra Azad Calame, MD 04/16/16 1707  Geoffery Lyonsouglas Delo, MD 04/17/16 1317

## 2016-04-16 NOTE — H&P (Signed)
Date: 04/16/2016               Patient Name:  Deanna Cross MRN: [id number]  DOB: Jun 01, 1926 Age / Sex: 82 y.o., female   PCP: No primary care provider on file.              Medical Service: Internal Medicine Teaching Service              Attending Physician: Dr. Inez CatalinaEmily B Mullen, MD    First Contact: Danelle Earthlyaryl Shawnelle Spoerl, MS 4 Pager: [phone]  Second Contact: Dr. Danella Pentonruong Pager: [phone]            After Hours (After 5p/  First Contact Pager: [phone]  weekends / holidays): Second Contact Pager: [phone]   Chief Complaint: weakness  History of Present Illness: Deanna Cross is an 82 year old woman with a history of hypothyroidism and CVA with right sided deficits that presents due to family concern for stroke.  Per witness, patient was tremulous, weak and disoriented this morning with increased dizziness. Patient only complains that she has difficulty chair this morning due to increased leg weakness. Denies chest pain, palpitations, shortness of breath, urinary incontinence, or recent falls.    Finishing up course of antibiotics (Levaquin) for URI that has persisted for the past 2 weeks. Treatment course complicated by an anaphylactic reaction to treatment and syncope one week ago.   Patient lives alone and able to perform ADLs.  Ambulates with a walker but often holds on to walls and tables to get around.  Recently started on lisinopril 5mg  for high blood pressure.  In ED, patient's weakness has improved with IV fluids.  CT and MRI negative for TIA.   Meds: Current Facility-Administered Medications  Medication Dose Route Frequency Provider Last Rate Last Dose  . 0.9 %  sodium chloride infusion   Intravenous Continuous Denton Brickiana M Truong, MD      . acetaminophen (TYLENOL) tablet 650 mg  650 mg Oral Q6H PRN Denton Brickiana M Truong, MD       Or  . acetaminophen (TYLENOL) suppository 650 mg  650 mg Rectal Q6H PRN Denton Brickiana M Truong, MD      . enoxaparin (LOVENOX) injection 40 mg  40 mg Subcutaneous Q24H Denton Brickiana M Truong,  MD       Current Outpatient Prescriptions  Medication Sig Dispense Refill  . ADVAIR DISKUS 250-50 MCG/DOSE AEPB Inhale 1 puff into the lungs 2 (two) times daily.     Marland Kitchen. aspirin EC 81 MG tablet Take 81 mg by mouth daily.    . Calcium Carb-Cholecalciferol (CALTRATE 600+D) 600-800 MG-UNIT TABS Take 1 tablet by mouth daily.    . citalopram (CELEXA) 20 MG tablet Take 20 mg by mouth daily.    Marland Kitchen. docusate sodium (COLACE) 100 MG capsule Take 100 mg by mouth every morning.     Marland Kitchen. levofloxacin (LEVAQUIN) 750 MG tablet Take 750 mg by mouth daily.    Marland Kitchen. levothyroxine (SYNTHROID, LEVOTHROID) 75 MCG tablet Take 75 mcg by mouth daily before breakfast.    . lisinopril (PRINIVIL,ZESTRIL) 5 MG tablet Take 5 mg by mouth every morning.    . mineral/vitamin supplement (MULTIGEN) 70 MG TABS tablet Take 1 tablet by mouth daily.    Marland Kitchen. omeprazole (PRILOSEC) 20 MG capsule Take 20 mg by mouth 2 (two) times daily.     . pravastatin (PRAVACHOL) 20 MG tablet Take 20 mg by mouth every evening.    Marland Kitchen. PROAIR HFA 108 (90 Base) MCG/ACT inhaler Inhale 2 puffs  into the lungs every 6 (six) hours as needed for wheezing or shortness of breath.     . traZODone (DESYREL) 50 MG tablet Take 50 mg by mouth at bedtime as needed for sleep.    . raloxifene (EVISTA) 60 MG tablet Take 60 mg by mouth daily.      Allergies: Allergies as of 04/16/2016 - Review Complete 04/16/2016  Allergen Reaction Noted  . Kenalog [triamcinolone acetonide] Anaphylaxis 04/16/2016  . Rocephin [ceftriaxone sodium in dextrose] Anaphylaxis 04/16/2016  . Accolate [zafirlukast] Other (See Comments) 04/16/2016  . Aleve [naproxen sodium] Nausea And Vomiting 04/16/2016  . Benadryl [diphenhydramine] Nausea And Vomiting 04/16/2016  . Ibuprofen Nausea And Vomiting 04/16/2016  . Macrolides and ketolides Other (See Comments) 04/16/2016   Past Medical History  Diagnosis Date  . Thyroid disease   . Anxiety   . High cholesterol   . GERD (gastroesophageal reflux disease)      Past Surgical History  Procedure Laterality Date  . Abdominal hysterectomy    . Knee arthroscopy      rt knee   Family History  Problem Relation Age of Onset  . Hypertension Mother   . Hypertension Father    Social History   Social History  . Marital Status: Widowed    Spouse Name: N/A  . Number of Children: N/A  . Years of Education: N/A   Occupational History  . Not on file.   Social History Main Topics  . Smoking status: Never Smoker   . Smokeless tobacco: Not on file  . Alcohol Use: No  . Drug Use: No  . Sexual Activity: Not on file   Other Topics Concern  . Not on file   Social History Narrative    Review of Systems: Constitutional: positive for fatigue, weakness, and dizziness Eyes: negative for visual disturbances Respiratory: negative for dyspnea on exertion Cardiovascular: negative for chest pain, chest pressure/discomfort and palpitations Gastrointestinal: negative for abdominal pain, constipation and diarrhea Genitourinary:negative for decreased stream, dysuria and frequency Musculoskeletal: positive for muscle weakness Behavioral/Psych: negative for depression, suicidal ideation, hallucinations   Physical Exam: Blood pressure 150/62, pulse 66, temperature 98.7 F (37.1 C), temperature source Oral, resp. rate 12, height 5\' 3"  (1.6 m), weight 48.081 kg (106 lb), SpO2 96 %. BP 150/62 mmHg  Pulse 66  Temp(Src) 98.7 F (37.1 C) (Oral)  Resp 12  Ht 5\' 3"  (1.6 m)  Wt 48.081 kg (106 lb)  BMI 18.78 kg/m2  SpO2 96%  General Appearance:    Alert, cooperative, no distress, laying in hospital bed  Head:    Normocephalic, without obvious abnormality, atraumatic  Eyes:    PERRL, conjunctiva/corneas clear, EOM's intact  Throat:   Oral mucosa dry  Neck:   Supple, symmetrical, trachea midline, no adenopathy;    thyroid:  no enlargement/tenderness/nodules; no carotid   bruit or JVD  Back:     Symmetric, no curvature, ROM normal, no CVA tenderness   Lungs:     Clear to auscultation bilaterally, respirations unlabored   Heart:    Regular rate and rhythm, S1 and S2 normal, no murmur, rub   or gallop  Breast Exam:    No tenderness, masses, or nipple abnormality  Abdomen:     Soft, non-tender, bowel sounds active all four quadrants,    no masses, no organomegaly  Extremities:   Extremities normal, atraumatic, no cyanosis or edema  Pulses:   2+ and symmetric all extremities  Skin:   Skin color, texture, turgor normal, no rashes  or lesions  Neurologic:   CNII-XII intact except for right lower facial droop, 4/5 strength in right lower extremity,  5/5 strength in bilateral upper extremities and left lower extremity, normal finger to nose test    Lab results: Basic Metabolic Panel:  Recent Labs  29/56/21 1048 04/16/16 1101  NA 135 135  K 3.6 3.6  CL 101 99*  CO2 25  --   GLUCOSE 91 86  BUN 19 22*  CREATININE 1.02* 1.00  CALCIUM 8.7*  --    Liver Function Tests:  Recent Labs  04/16/16 1048  AST 20  ALT 16  ALKPHOS 38  BILITOT 0.7  PROT 6.3*  ALBUMIN 3.2*   No results for input(s): LIPASE, AMYLASE in the last 72 hours. No results for input(s): AMMONIA in the last 72 hours. CBC:  Recent Labs  04/16/16 1048 04/16/16 1101  WBC 7.4  --   NEUTROABS 5.4  --   HGB 10.3* 10.9*  HCT 31.1* 32.0*  MCV 96.6  --   PLT 327  --    Coagulation:  Recent Labs  04/16/16 1048  LABPROT 15.4*  INR 1.20   Urine Drug Screen: Drugs of Abuse     Component Value Date/Time   LABOPIA POSITIVE* 04/16/2016 1208   COCAINSCRNUR NONE DETECTED 04/16/2016 1208   LABBENZ NONE DETECTED 04/16/2016 1208   AMPHETMU NONE DETECTED 04/16/2016 1208   THCU NONE DETECTED 04/16/2016 1208   LABBARB NONE DETECTED 04/16/2016 1208    Alcohol Level:  Recent Labs  04/16/16 1048  ETH <5   Urinalysis:  Recent Labs  04/16/16 1208  COLORURINE YELLOW  LABSPEC 1.013  PHURINE 7.5  GLUCOSEU NEGATIVE  HGBUR NEGATIVE  BILIRUBINUR NEGATIVE   KETONESUR NEGATIVE  PROTEINUR NEGATIVE  NITRITE NEGATIVE  LEUKOCYTESUR NEGATIVE   Imaging results:  Ct Head Wo Contrast  04/16/2016  CLINICAL DATA:  Allergic reaction in syncopal episode approximately 1 week ago. Concern for TIA. EXAM: CT HEAD WITHOUT CONTRAST TECHNIQUE: Contiguous axial images were obtained from the base of the skull through the vertex without intravenous contrast. COMPARISON:  04/09/2016 FINDINGS: Brain: Similar findings of advanced atrophy with sulcal prominence centralized volume loss with commensurate ex vacuo dilatation of the ventricular system. Extensive periventricular hypodensities compatible microvascular ischemic disease. Old lacunar infarct within the bilateral insular cortices, right greater left (representative image 11, series 2). Given extensive background parenchymal abnormalities, there is no CT evidence of superimposed acute large territory infarct. No intraparenchymal or extra-axial mass or hemorrhage. Unchanged size and configuration of the ventricles and basilar cisterns. No midline shift. Vascular: Intracranial atherosclerosis. Skull: Negative for fracture or focal lesion. Sinuses/Orbits: Minimally mucosal thickening involving the bilateral sphenoid sinuses. The remaining paranasal sinuses and mastoid air cells are normally aerated. No air-fluid levels. Other: Regional soft tissues appear normal. IMPRESSION: Similar findings of advanced atrophy and mild microvascular ischemic disease without acute intracranial process. Electronically Signed   By: Simonne Come M.D.   On: 04/16/2016 11:30   Mr Shirlee Latch Wo Contrast  04/16/2016  CLINICAL DATA:  82 year old female with depression and altered mental status. Weakness. Syncopal episode 1 week ago. Subsequent encounter. EXAM: MRI HEAD WITHOUT CONTRAST MRA HEAD WITHOUT CONTRAST TECHNIQUE: Multiplanar, multiecho pulse sequences of the brain and surrounding structures were obtained without intravenous contrast. Angiographic  images of the head were obtained using MRA technique without contrast. COMPARISON:  04/16/2016 head CT. FINDINGS: MRI HEAD FINDINGS No acute infarct or intracranial hemorrhage. Remote small mid right coronal radiata infarct. Remote tiny right  cerebellar infarct. Mild to slightly moderate chronic microvascular changes. Incidentally noted are prominent perivascular spaces in the basal ganglia. Mild global atrophy without hydrocephalus. No intracranial mass lesion noted on this unenhanced exam. Post lens replacement otherwise orbital structures unremarkable. Cervical medullary junction within normal limits. Straightening of the cervical spine with mild degenerative changes without high-grade stenosis. Partially empty non expanded sella incidentally noted. Minimal mucosal thickening ethmoid sinus air cells, right maxillary sinus and right sphenoid sinus. MRA HEAD FINDINGS Exam is motion degraded. This limits evaluation for evaluating intracranial atherosclerotic type changes and for detecting aneurysm. All that can be stated with certainty on the present examination is that there is flow within portions of the internal carotid arteries, vertebral arteries and basilar artery. Findings are suspicious for intracranial atherosclerotic changes with medium and small size vessel stenosis. Bulge left lateral aspect of the basilar tip may be related to artifact/atherosclerotic type changes rather than aneurysm. IMPRESSION: MRI HEAD No acute infarct or intracranial hemorrhage. Remote small mid right coronal radiata infarct. Remote tiny right cerebellar infarct. Mild to slightly moderate chronic microvascular changes. Mild global atrophy without hydrocephalus. No intracranial mass lesion noted on this unenhanced exam. MRA HEAD Exam is motion degraded. This limits evaluation for evaluating intracranial atherosclerotic type changes and for detecting aneurysm. All that can be stated with certainty on the present examination is that  there is flow within portions of the internal carotid arteries, vertebral arteries and basilar artery. Findings are suspicious for intracranial atherosclerotic changes with medium and small size vessel stenosis. Electronically Signed   By: Lacy Duverney M.D.   On: 04/16/2016 15:37   Mr Brain Wo Contrast  04/16/2016  CLINICAL DATA:  82 year old female with depression and altered mental status. Weakness. Syncopal episode 1 week ago. Subsequent encounter. EXAM: MRI HEAD WITHOUT CONTRAST MRA HEAD WITHOUT CONTRAST TECHNIQUE: Multiplanar, multiecho pulse sequences of the brain and surrounding structures were obtained without intravenous contrast. Angiographic images of the head were obtained using MRA technique without contrast. COMPARISON:  04/16/2016 head CT. FINDINGS: MRI HEAD FINDINGS No acute infarct or intracranial hemorrhage. Remote small mid right coronal radiata infarct. Remote tiny right cerebellar infarct. Mild to slightly moderate chronic microvascular changes. Incidentally noted are prominent perivascular spaces in the basal ganglia. Mild global atrophy without hydrocephalus. No intracranial mass lesion noted on this unenhanced exam. Post lens replacement otherwise orbital structures unremarkable. Cervical medullary junction within normal limits. Straightening of the cervical spine with mild degenerative changes without high-grade stenosis. Partially empty non expanded sella incidentally noted. Minimal mucosal thickening ethmoid sinus air cells, right maxillary sinus and right sphenoid sinus. MRA HEAD FINDINGS Exam is motion degraded. This limits evaluation for evaluating intracranial atherosclerotic type changes and for detecting aneurysm. All that can be stated with certainty on the present examination is that there is flow within portions of the internal carotid arteries, vertebral arteries and basilar artery. Findings are suspicious for intracranial atherosclerotic changes with medium and small size  vessel stenosis. Bulge left lateral aspect of the basilar tip may be related to artifact/atherosclerotic type changes rather than aneurysm. IMPRESSION: MRI HEAD No acute infarct or intracranial hemorrhage. Remote small mid right coronal radiata infarct. Remote tiny right cerebellar infarct. Mild to slightly moderate chronic microvascular changes. Mild global atrophy without hydrocephalus. No intracranial mass lesion noted on this unenhanced exam. MRA HEAD Exam is motion degraded. This limits evaluation for evaluating intracranial atherosclerotic type changes and for detecting aneurysm. All that can be stated with certainty on the present examination is  that there is flow within portions of the internal carotid arteries, vertebral arteries and basilar artery. Findings are suspicious for intracranial atherosclerotic changes with medium and small size vessel stenosis. Electronically Signed   By: Lacy Duverney M.D.   On: 04/16/2016 15:37   Assessment & Plan by Problem: Active Problems:   Generalized weakness   TIA (transient ischemic attack)   Bilateral Leg Weakness:  Differential includes Dehydration/Hypovolemia vs. Infection vs. TIA vs. Hypothyroidism vs. frailty.  Recently experienced stressors including recent admission to behavioral health for depression/SI and recovery from URI may have lead to decreased fluid intake or nutrition.  Patient's labs shows patient has prerenal AKI suggesting dehydration.  UTI unlikely as UA negative. TIA not likely as CT head and MRI are negative. Patient recently started on Lisinopril for newly diagnosed 3 weeks ago.  Followed by neurology.  Orthostatic vitals negative but completed after IV fluids given. Of note, UDS positive for opiates which can also contribute to orthostasis.  Patient was also taking more than recommended dose of Levaquin for or renal function.  She has been taking  750mg  Levaquin daily although her CrCl is 29.5.  It is recommended to adjust  to 750mg  q48  hours for patients with CrCl between 20-49. -- Continue IV 0.9% NS  gtt -- monitor of telemetry as patient experienced possible presyncope -- monitor with pulse oximetry -- CXR to evaluate for infection -- PT consulted   Acute Kidney Injury:  Prerenal azotemia.  Likely due to dehydration. Recently presecribed lisinopril which could have also contributed to her AKI.  -- continue to monitor with BMP -- order FeNa   Hypertension:  Hypertensive to 170s upon admission.  Recently diagnosed with HTN and treated with Lisinopril 5mg  3 weeks ago -- hold home lisinopril as patient has AKI -- consider Norvasc for blood pressure control  Normocytic Anemia:  Likely nutritional anemia -- order vital B12 -- order ferritin level  Hypothyroidism -- continue home synthroid daily -- r/p TSH  Depression/Anxiety- stable: No SI/HI -- continue celexa 20mg  daily -- continue Trazodone 50mg  at bedtime  History of CVA -- continue home 81mg  Aspirin  PPX -- DVT: Lovenox -- Fall precautions  Code status: FULL CODE  Dispo: pending clinical improvement  This is a Psychologist, occupational Note.  The care of the patient was discussed with Dr. Danella Penton and the assessment and plan was formulated with their assistance.  Please see their note for official documentation of the patient encounter.   Signed: Danelle Earthly, Med Student 04/16/2016, 5:39 PM

## 2016-04-16 NOTE — Consult Note (Addendum)
NEURO HOSPITALIST CONSULT NOTE   Requestig physician: Dr. Judd Lien   Reason for Consult: stuttering, transient weakness   History obtained from:   Chart  Patient   HPI:                                                                                                                                          Deanna Cross is an 82 y.o. female who was recently in [facility] for suicidal ideation and significant depression due to her husbans death one year prior. Per chart on 03/14/2016 "Pt and supports report continued grief since pt's husband passed (12.2016). Pt has also been experiencing persistent episodes of intense fear, anxiety and what appears to be panic attacks from provided description. Pt reports no psychiatric history. Pt shared several times during interview that she wanted to die and did not want to live anymore. Pt expressed that she wished someone would kill her. Pt stated "If I had a gun I would shoot myself but I don't have one". Pt also shared thoughts of drowning herself. " Today patient was brought to ED by EMS due to family concerned she was having stroke like symptoms. Per ED note "PT was last seen 2 days ago by family and was normal at that time. Family and EMS describe speech changes as "stuttering". EMS describes speech as repetitive upon arrival. During transport speech improved. PT has had a previous stroke with lingering right sided deficits. " on consultation she had no speech difficulty and her main complaint is that she felt dizzy last night and tried to get in bed but felt BILATERAL legs felt weak and she had to "roll "into her bed. This AM she was able to walk with her walker to eat but when she attempted to get up she could not. EMS was called and she was brought to hospital. CT head showed no acute bleed or infarct.    Past Medical History  Diagnosis Date  . Thyroid disease   . Anxiety   . High cholesterol   . GERD (gastroesophageal reflux disease)     Past  Surgical History  Procedure Laterality Date  . Abdominal hysterectomy      Family History  Problem Relation Age of Onset  . Hypertension Mother   . Hypertension Father      Social History:  reports that she has never smoked. She does not have any smokeless tobacco history on file. She reports that she does not drink alcohol or use illicit drugs.  Allergies  Allergen Reactions  . Accolate [Zafirlukast]   . Aleve [Naproxen Sodium]   . Benadryl [Diphenhydramine] Nausea And Vomiting  . Ibuprofen   . Kenalog [Triamcinolone Acetonide]   . Macrolides And Ketolides   . Rocephin [Ceftriaxone Sodium In Dextrose]  MEDICATIONS:                                                                                                                     No current facility-administered medications for this encounter.   Current Outpatient Prescriptions  Medication Sig Dispense Refill  . ADVAIR DISKUS 250-50 MCG/DOSE AEPB Inhale 2 puffs into the lungs 2 (two) times daily.    Marland Kitchen. aspirin EC 81 MG tablet Take 81 mg by mouth daily.    . Calcium Carb-Cholecalciferol (CALTRATE 600+D) 600-800 MG-UNIT TABS Take 1 tablet by mouth daily.    . citalopram (CELEXA) 20 MG tablet Take 20 mg by mouth daily.    Marland Kitchen. docusate sodium (COLACE) 100 MG capsule Take 100 mg by mouth every morning.     Marland Kitchen. levothyroxine (SYNTHROID, LEVOTHROID) 75 MCG tablet Take 75 mcg by mouth daily before breakfast.    . mineral/vitamin supplement (MULTIGEN) 70 MG TABS tablet Take 1 tablet by mouth daily.    Marland Kitchen. omeprazole (PRILOSEC) 20 MG capsule Take 20 mg by mouth 2 (two) times daily.     . pravastatin (PRAVACHOL) 20 MG tablet Take 20 mg by mouth every evening.    . raloxifene (EVISTA) 60 MG tablet Take 60 mg by mouth daily.    Marland Kitchen. zolpidem (AMBIEN) 5 MG tablet Take 5 mg by mouth at bedtime.         ROS:                                                                                                                                        History obtained from the patient  General ROS: negative for - chills, fatigue, fever, night sweats, weight gain or weight loss Psychological ROS: negative for - behavioral disorder, hallucinations, memory difficulties, mood swings or suicidal ideation Ophthalmic ROS: negative for - blurry vision, double vision, eye pain or loss of vision ENT ROS: negative for - epistaxis, nasal discharge, oral lesions, sore throat, tinnitus or vertigo Allergy and Immunology ROS: negative for - hives or itchy/watery eyes Hematological and Lymphatic ROS: negative for - bleeding problems, bruising or swollen lymph nodes Endocrine ROS: negative for - galactorrhea, hair pattern changes, polydipsia/polyuria or temperature intolerance Respiratory ROS: negative for - cough, hemoptysis, shortness of breath or wheezing Cardiovascular ROS: negative for - chest pain, dyspnea on exertion, edema or irregular heartbeat Gastrointestinal ROS: negative for - abdominal pain, diarrhea, hematemesis,  nausea/vomiting or stool incontinence Genito-Urinary ROS: negative for - dysuria, hematuria, incontinence or urinary frequency/urgency Musculoskeletal ROS: negative for - joint swelling or muscular weakness Neurological ROS: as noted in HPI Dermatological ROS: negative for rash and skin lesion changes   Blood pressure 163/58, pulse 77, temperature 98.7 F (37.1 C), temperature source Oral, resp. rate 13, height 5\' 3"  (1.6 m), weight 48.081 kg (106 lb), SpO2 98 %.   Neurologic Examination:                                                                                                      HEENT-  Normocephalic, no lesions, without obvious abnormality.  Normal external eye and conjunctiva.  Normal TM's bilaterally.  Normal auditory canals and external ears. Normal external nose, mucus membranes and septum.  Normal pharynx. Cardiovascular- S1, S2 normal, pulses palpable throughout   Lungs- chest clear, no wheezing, rales, normal  symmetric air entry Abdomen- normal findings: bowel sounds normal Extremities- no clubbing Lymph-no adenopathy palpable Musculoskeletal-no joint tenderness, deformity or swelling Skin-warm and dry, no hyperpigmentation, vitiligo, or suspicious lesions  Neurological Examination Mental Status: Alert, oriented, thought content appropriate.  Speech fluent without evidence of aphasia.  Able to follow 3 step commands without difficulty. Cranial Nerves: II:  Visual fields grossly normal, pupils equal, round, reactive to light and accommodation III,IV, VI: ptosis not present, extra-ocular motions intact bilaterally V,VII: smile symmetric, facial light touch sensation normal bilaterally VIII: hearing normal bilaterally IX,X: uvula rises symmetrically XI: bilateral shoulder shrug XII: midline tongue extension Motor: Right : Upper extremity   5/5    Left:     Upper extremity   5/5  Lower extremity   4/5     Lower extremity   4/5 --significant give way weakness in bilateral legs on exam but full strength in DF/PF Tone and bulk:normal tone throughout; no atrophy noted Sensory: Pinprick and light touch intact throughout, bilaterally Deep Tendon Reflexes: 1+ and symmetric throughout Plantars: Right: downgoing   Left: downgoing Cerebellar: normal finger-to-nose,  and normal heel-to-shin test Gait: not tested      Lab Results: Basic Metabolic Panel:  Recent Labs Lab 04/16/16 1048 04/16/16 1101  NA 135 135  K 3.6 3.6  CL 101 99*  CO2 25  --   GLUCOSE 91 86  BUN 19 22*  CREATININE 1.02* 1.00  CALCIUM 8.7*  --     Liver Function Tests:  Recent Labs Lab 04/16/16 1048  AST 20  ALT 16  ALKPHOS 38  BILITOT 0.7  PROT 6.3*  ALBUMIN 3.2*   No results for input(s): LIPASE, AMYLASE in the last 168 hours. No results for input(s): AMMONIA in the last 168 hours.  CBC:  Recent Labs Lab 04/16/16 1048 04/16/16 1101  WBC 7.4  --   NEUTROABS 5.4  --   HGB 10.3* 10.9*  HCT 31.1*  32.0*  MCV 96.6  --   PLT 327  --     Cardiac Enzymes: No results for input(s): CKTOTAL, CKMB, CKMBINDEX, TROPONINI in the last 168 hours.  Lipid Panel: No results for input(s):  CHOL, TRIG, HDL, CHOLHDL, VLDL, LDLCALC in the last 168 hours.  CBG: No results for input(s): GLUCAP in the last 168 hours.  Microbiology: No results found for this or any previous visit.  Coagulation Studies:  Recent Labs  04/16/16 1048  LABPROT 15.4*  INR 1.20    Imaging: Ct Head Wo Contrast  04/16/2016  CLINICAL DATA:  Allergic reaction in syncopal episode approximately 1 week ago. Concern for TIA. EXAM: CT HEAD WITHOUT CONTRAST TECHNIQUE: Contiguous axial images were obtained from the base of the skull through the vertex without intravenous contrast. COMPARISON:  04/09/2016 FINDINGS: Brain: Similar findings of advanced atrophy with sulcal prominence centralized volume loss with commensurate ex vacuo dilatation of the ventricular system. Extensive periventricular hypodensities compatible microvascular ischemic disease. Old lacunar infarct within the bilateral insular cortices, right greater left (representative image 11, series 2). Given extensive background parenchymal abnormalities, there is no CT evidence of superimposed acute large territory infarct. No intraparenchymal or extra-axial mass or hemorrhage. Unchanged size and configuration of the ventricles and basilar cisterns. No midline shift. Vascular: Intracranial atherosclerosis. Skull: Negative for fracture or focal lesion. Sinuses/Orbits: Minimally mucosal thickening involving the bilateral sphenoid sinuses. The remaining paranasal sinuses and mastoid air cells are normally aerated. No air-fluid levels. Other: Regional soft tissues appear normal. IMPRESSION: Similar findings of advanced atrophy and mild microvascular ischemic disease without acute intracranial process. Electronically Signed   By: Simonne Come M.D.   On: 04/16/2016 11:30        Assessment and plan per attending neurologist  Felicie Morn PA-C [facility] [phone]  04/16/2016, 12:32 PM   Assessment/Plan: 82 year old female with a history of generalized weakness and dizziness that started last night, continue this morning. The description that she has concerns me for possible presyncope, and she states that she feels better after having received a bolus of fluid. I'm wondering if she had some mild dehydration which is improving. Dizziness also could be BPPV but I did not get clear evidence of this on exam.  There is no myelopathic signs on her exam today me think that she has any type of cord lesion. MRI brain is negative for stroke.   1) check orthostatics 2) would favor gentle hydration 3) if she continues to have significant symptoms tomorrow, could consider spinal imaging but I think this is very low yield.  Ritta Slot, MD [facility] [phone]  If 7pm- 7am, please page neurology on call as listed in [facility]ION.

## 2016-04-16 NOTE — ED Notes (Signed)
Dinner tray ordered, regular diet 

## 2016-04-16 NOTE — H&P (Signed)
Date: 04/16/2016               Patient Name:  Deanna Cross MRN: [id number]  DOB: December 09, 1925 Age / Sex: 82 y.o., female   PCP: No primary care provider on file.         Medical Service: Internal Medicine Teaching Service         Attending Physician: Dr. Inez Catalina, MD    First Contact: MS4 Danelle Earthly Pager: [phone]  Second Contact: Dr. Gara Kroner Pager: [phone]       After Hours (After 5p/  First Contact Pager: [phone]  weekends / holidays): Second Contact Pager: [phone]   Chief Complaint: weakness  History of Present Illness: 17F w/ PMHx of CVA w/ residual rt sided weakness, newly dx HTN, hypothyroidism, GERD, depression, and HLD who presented w/ b/l weakness while at home. She is accompanied by her 2 neighbors in the exam room who are very involved in her care. She noticed this morning that she was not able to get up from her chair and that the room was spinning after she sat back down. She then called her neighbor who noted that she had slurred speech on the phone. On arrival to her house the neighbor saw that the patient was slumped over to the left side, tremulous, disoriented, and weak. She wears briefs due to difficulty ambulating to the bathroom but denies urinary or bowel incontinence today. Prior to episode of weakness she was last seen normal 2 days ago. On EMS arrival her speech was repetitive which improved during transport. She has been treated for an URI 2 weeks ago w/ rocephin and kenalog resulting in an allergic rxn during which patient passed out at the physicians office. She reports her URI sx did not go away and she was given a course of levaquin. She is still complaining of a cough though reports it has improved. Her cough is productive of clear/white sputum. She states she has a good appetite and eats 3 meals a day with adequate fluid intake although her neighbors disagree.   Of note she was recently hospitalized for depression w/ SI last month at [facility]. During  that time she was dx with hypertension and started on lisinopril 5mg . She denies SI, HI, and auditory hallucinations. She does state that sometimes she will see things at the periphery of her vision and then turn around to find that nothing is there. She reports her mood is much better than before. She lives alone and is independent w/ ADLs. She uses a walker to ambulate but her neighbors have noticed that she has been wall walking more, they are afraid that she has been getting more unsteady on her feet. She has hypothyroidism and her TSH last month was elevated at 9.393, she reports that her synthroid was adjusted at that time.    In the ED a CT head negative for bleed, MRI/MRA negative for acute infarct. Troponin negative. UA negative for infection. She was started on IVFs and states her weakness has improved. Her UDS was positive for opiates where are not on her med list. Neurology saw patient in the ED and recommended orthostatic vitals w/ gentle hydration.   Meds: Current Facility-Administered Medications  Medication Dose Route Frequency Provider Last Rate Last Dose  . 0.9 %  sodium chloride infusion   Intravenous Continuous Denton Brick, MD      . acetaminophen (TYLENOL) tablet 650 mg  650 mg Oral Q6H PRN Denton Brick, MD  Or  . acetaminophen (TYLENOL) suppository 650 mg  650 mg Rectal Q6H PRN Denton Brickiana M Truong, MD      . enoxaparin (LOVENOX) injection 40 mg  40 mg Subcutaneous Q24H Denton Brickiana M Truong, MD       Current Outpatient Prescriptions  Medication Sig Dispense Refill  . ADVAIR DISKUS 250-50 MCG/DOSE AEPB Inhale 1 puff into the lungs 2 (two) times daily.     Marland Kitchen. aspirin EC 81 MG tablet Take 81 mg by mouth daily.    . Calcium Carb-Cholecalciferol (CALTRATE 600+D) 600-800 MG-UNIT TABS Take 1 tablet by mouth daily.    . citalopram (CELEXA) 20 MG tablet Take 20 mg by mouth daily.    Marland Kitchen. docusate sodium (COLACE) 100 MG capsule Take 100 mg by mouth every morning.     Marland Kitchen. levofloxacin  (LEVAQUIN) 750 MG tablet Take 750 mg by mouth daily.    Marland Kitchen. levothyroxine (SYNTHROID, LEVOTHROID) 75 MCG tablet Take 75 mcg by mouth daily before breakfast.    . lisinopril (PRINIVIL,ZESTRIL) 5 MG tablet Take 5 mg by mouth every morning.    . mineral/vitamin supplement (MULTIGEN) 70 MG TABS tablet Take 1 tablet by mouth daily.    Marland Kitchen. omeprazole (PRILOSEC) 20 MG capsule Take 20 mg by mouth 2 (two) times daily.     . pravastatin (PRAVACHOL) 20 MG tablet Take 20 mg by mouth every evening.    Marland Kitchen. PROAIR HFA 108 (90 Base) MCG/ACT inhaler Inhale 2 puffs into the lungs every 6 (six) hours as needed for wheezing or shortness of breath.     . traZODone (DESYREL) 50 MG tablet Take 50 mg by mouth at bedtime as needed for sleep.    . raloxifene (EVISTA) 60 MG tablet Take 60 mg by mouth daily.      Allergies: Allergies as of 04/16/2016 - Review Complete 04/16/2016  Allergen Reaction Noted  . Kenalog [triamcinolone acetonide] Anaphylaxis 04/16/2016  . Rocephin [ceftriaxone sodium in dextrose] Anaphylaxis 04/16/2016  . Accolate [zafirlukast] Other (See Comments) 04/16/2016  . Aleve [naproxen sodium] Nausea And Vomiting 04/16/2016  . Benadryl [diphenhydramine] Nausea And Vomiting 04/16/2016  . Ibuprofen Nausea And Vomiting 04/16/2016  . Macrolides and ketolides Other (See Comments) 04/16/2016   Past Medical History  Diagnosis Date  . Thyroid disease   . Anxiety   . High cholesterol   . GERD (gastroesophageal reflux disease)    Past Surgical History  Procedure Laterality Date  . Abdominal hysterectomy    . Knee arthroscopy      rt knee   Family History  Problem Relation Age of Onset  . Hypertension Mother   . Hypertension Father    Social History   Social History  . Marital Status: Widowed    Spouse Name: N/A  . Number of Children: N/A  . Years of Education: N/A   Occupational History  . Not on file.   Social History Main Topics  . Smoking status: Never Smoker   . Smokeless tobacco: Not  on file  . Alcohol Use: No  . Drug Use: No  . Sexual Activity: Not on file   Other Topics Concern  . Not on file   Social History Narrative    Review of Systems: Review of Systems  Constitutional: Negative for fever and chills.  Respiratory: Positive for cough and sputum production.   Cardiovascular: Positive for palpitations (occasional). Negative for chest pain.  Gastrointestinal: Negative for nausea, vomiting, abdominal pain and diarrhea.  Genitourinary: Negative for dysuria, urgency and frequency.  Skin:  Negative for rash.  Neurological: Positive for dizziness, tremors, speech change and weakness.  Endo/Heme/Allergies: Bruises/bleeds easily.  Psychiatric/Behavioral: Negative for suicidal ideas and hallucinations.     Physical Exam: Blood pressure 150/62, pulse 66, temperature 98.7 F (37.1 C), temperature source Oral, resp. rate 12, height 5\' 3"  (1.6 m), weight 106 lb (48.081 kg), SpO2 96 %. Physical Exam  Constitutional: She appears well-developed and well-nourished. No distress.  HENT:  Head: Normocephalic and atraumatic.  Nose: Nose normal.  Dry mucous membranes   Eyes: Conjunctivae and EOM are normal. Pupils are equal, round, and reactive to light. Right eye exhibits no discharge. Left eye exhibits no discharge. No scleral icterus.  Cardiovascular: Normal rate, regular rhythm and normal heart sounds.  Exam reveals no gallop and no friction rub.   No murmur heard. Pulmonary/Chest: Effort normal and breath sounds normal. No respiratory distress. She has no wheezes. She has no rales.  Abdominal: Soft. Bowel sounds are normal. She exhibits no distension and no mass. There is no tenderness. There is no rebound and no guarding.  Musculoskeletal: She exhibits no edema.  Neurological:  Rt facial droop, +4/5 RLE, 5/5 LLE, neg for dyskinesia, intact sensation, no tongue deviation.   Skin: Skin is warm and dry. No rash noted. She is not diaphoretic. No erythema. No pallor.     Lab results: Basic Metabolic Panel:  Recent Labs  16/10/96 1048 04/16/16 1101  NA 135 135  K 3.6 3.6  CL 101 99*  CO2 25  --   GLUCOSE 91 86  BUN 19 22*  CREATININE 1.02* 1.00  CALCIUM 8.7*  --    Liver Function Tests:  Recent Labs  04/16/16 1048  AST 20  ALT 16  ALKPHOS 38  BILITOT 0.7  PROT 6.3*  ALBUMIN 3.2*   CBC:  Recent Labs  04/16/16 1048 04/16/16 1101  WBC 7.4  --   NEUTROABS 5.4  --   HGB 10.3* 10.9*  HCT 31.1* 32.0*  MCV 96.6  --   PLT 327  --    CCoagulation:  Recent Labs  04/16/16 1048  LABPROT 15.4*  INR 1.20   Urine Drug Screen: Drugs of Abuse     Component Value Date/Time   LABOPIA POSITIVE* 04/16/2016 1208   COCAINSCRNUR NONE DETECTED 04/16/2016 1208   LABBENZ NONE DETECTED 04/16/2016 1208   AMPHETMU NONE DETECTED 04/16/2016 1208   THCU NONE DETECTED 04/16/2016 1208   LABBARB NONE DETECTED 04/16/2016 1208    Alcohol Level:  Recent Labs  04/16/16 1048  ETH <5   Urinalysis:  Recent Labs  04/16/16 1208  COLORURINE YELLOW  LABSPEC 1.013  PHURINE 7.5  GLUCOSEU NEGATIVE  HGBUR NEGATIVE  BILIRUBINUR NEGATIVE  KETONESUR NEGATIVE  PROTEINUR NEGATIVE  NITRITE NEGATIVE  LEUKOCYTESUR NEGATIVE    Imaging results:  Ct Head Wo Contrast  04/16/2016  CLINICAL DATA:  Allergic reaction in syncopal episode approximately 1 week ago. Concern for TIA. EXAM: CT HEAD WITHOUT CONTRAST TECHNIQUE: Contiguous axial images were obtained from the base of the skull through the vertex without intravenous contrast. COMPARISON:  04/09/2016 FINDINGS: Brain: Similar findings of advanced atrophy with sulcal prominence centralized volume loss with commensurate ex vacuo dilatation of the ventricular system. Extensive periventricular hypodensities compatible microvascular ischemic disease. Old lacunar infarct within the bilateral insular cortices, right greater left (representative image 11, series 2). Given extensive background parenchymal  abnormalities, there is no CT evidence of superimposed acute large territory infarct. No intraparenchymal or extra-axial mass or hemorrhage. Unchanged size and  configuration of the ventricles and basilar cisterns. No midline shift. Vascular: Intracranial atherosclerosis. Skull: Negative for fracture or focal lesion. Sinuses/Orbits: Minimally mucosal thickening involving the bilateral sphenoid sinuses. The remaining paranasal sinuses and mastoid air cells are normally aerated. No air-fluid levels. Other: Regional soft tissues appear normal. IMPRESSION: Similar findings of advanced atrophy and mild microvascular ischemic disease without acute intracranial process. Electronically Signed   By: Simonne Come M.D.   On: 04/16/2016 11:30   Mr Shirlee Latch Wo Contrast  04/16/2016  CLINICAL DATA:  82 year old female with depression and altered mental status. Weakness. Syncopal episode 1 week ago. Subsequent encounter. EXAM: MRI HEAD WITHOUT CONTRAST MRA HEAD WITHOUT CONTRAST TECHNIQUE: Multiplanar, multiecho pulse sequences of the brain and surrounding structures were obtained without intravenous contrast. Angiographic images of the head were obtained using MRA technique without contrast. COMPARISON:  04/16/2016 head CT. FINDINGS: MRI HEAD FINDINGS No acute infarct or intracranial hemorrhage. Remote small mid right coronal radiata infarct. Remote tiny right cerebellar infarct. Mild to slightly moderate chronic microvascular changes. Incidentally noted are prominent perivascular spaces in the basal ganglia. Mild global atrophy without hydrocephalus. No intracranial mass lesion noted on this unenhanced exam. Post lens replacement otherwise orbital structures unremarkable. Cervical medullary junction within normal limits. Straightening of the cervical spine with mild degenerative changes without high-grade stenosis. Partially empty non expanded sella incidentally noted. Minimal mucosal thickening ethmoid sinus air cells, right  maxillary sinus and right sphenoid sinus. MRA HEAD FINDINGS Exam is motion degraded. This limits evaluation for evaluating intracranial atherosclerotic type changes and for detecting aneurysm. All that can be stated with certainty on the present examination is that there is flow within portions of the internal carotid arteries, vertebral arteries and basilar artery. Findings are suspicious for intracranial atherosclerotic changes with medium and small size vessel stenosis. Bulge left lateral aspect of the basilar tip may be related to artifact/atherosclerotic type changes rather than aneurysm. IMPRESSION: MRI HEAD No acute infarct or intracranial hemorrhage. Remote small mid right coronal radiata infarct. Remote tiny right cerebellar infarct. Mild to slightly moderate chronic microvascular changes. Mild global atrophy without hydrocephalus. No intracranial mass lesion noted on this unenhanced exam. MRA HEAD Exam is motion degraded. This limits evaluation for evaluating intracranial atherosclerotic type changes and for detecting aneurysm. All that can be stated with certainty on the present examination is that there is flow within portions of the internal carotid arteries, vertebral arteries and basilar artery. Findings are suspicious for intracranial atherosclerotic changes with medium and small size vessel stenosis. Electronically Signed   By: Lacy Duverney M.D.   On: 04/16/2016 15:37   Mr Brain Wo Contrast  04/16/2016  CLINICAL DATA:  82 year old female with depression and altered mental status. Weakness. Syncopal episode 1 week ago. Subsequent encounter. EXAM: MRI HEAD WITHOUT CONTRAST MRA HEAD WITHOUT CONTRAST TECHNIQUE: Multiplanar, multiecho pulse sequences of the brain and surrounding structures were obtained without intravenous contrast. Angiographic images of the head were obtained using MRA technique without contrast. COMPARISON:  04/16/2016 head CT. FINDINGS: MRI HEAD FINDINGS No acute infarct or  intracranial hemorrhage. Remote small mid right coronal radiata infarct. Remote tiny right cerebellar infarct. Mild to slightly moderate chronic microvascular changes. Incidentally noted are prominent perivascular spaces in the basal ganglia. Mild global atrophy without hydrocephalus. No intracranial mass lesion noted on this unenhanced exam. Post lens replacement otherwise orbital structures unremarkable. Cervical medullary junction within normal limits. Straightening of the cervical spine with mild degenerative changes without high-grade stenosis. Partially empty non expanded sella  incidentally noted. Minimal mucosal thickening ethmoid sinus air cells, right maxillary sinus and right sphenoid sinus. MRA HEAD FINDINGS Exam is motion degraded. This limits evaluation for evaluating intracranial atherosclerotic type changes and for detecting aneurysm. All that can be stated with certainty on the present examination is that there is flow within portions of the internal carotid arteries, vertebral arteries and basilar artery. Findings are suspicious for intracranial atherosclerotic changes with medium and small size vessel stenosis. Bulge left lateral aspect of the basilar tip may be related to artifact/atherosclerotic type changes rather than aneurysm. IMPRESSION: MRI HEAD No acute infarct or intracranial hemorrhage. Remote small mid right coronal radiata infarct. Remote tiny right cerebellar infarct. Mild to slightly moderate chronic microvascular changes. Mild global atrophy without hydrocephalus. No intracranial mass lesion noted on this unenhanced exam. MRA HEAD Exam is motion degraded. This limits evaluation for evaluating intracranial atherosclerotic type changes and for detecting aneurysm. All that can be stated with certainty on the present examination is that there is flow within portions of the internal carotid arteries, vertebral arteries and basilar artery. Findings are suspicious for intracranial  atherosclerotic changes with medium and small size vessel stenosis. Electronically Signed   By: Lacy Duverney M.D.   On: 04/16/2016 15:37    Other results: EKG Interpretation  Date/Time:  Thursday April 16 2016 10:16:15 EDT Ventricular Rate:  85 PR Interval:  158 QRS Duration: 83 QT Interval:  402 QTC Calculation: 478 R Axis:   -25 Text Interpretation:  Sinus rhythm Left axis deviation Anterior Infarct, old Confirmed by DELO  MD, DOUGLAS ([id number]) on 04/16/2016 1:29:20 PM  Assessment & Plan by Problem: Principal Problem:   Leg weakness, bilateral Active Problems:   Orthostatic hypotension   Hypothyroidism   History of CVA (cerebrovascular accident)   Anemia   AKI (acute kidney injury) (HCC)   Anxiety   Insomnia  1. Lower extremity weakness-- Head CT, MRI/MRA negative for acute stroke. Her symptoms are bilateral and have greatly improved since ED presentation with IV hydration. Thus unlikely she had a CVA or TIA, however will monitor for any arrhythmias overnight as she does report occasional palpitations. She reports feeling that the room was spinning when she tried to stand up, along with new BP medication she may have had orthostatic hypotension. Her orthostatic vitals were negative but she was already receiving IVFs at that time. Seizure and hypoglycemia would be on the DDx w/ her confusion and tremors noted by neighbor on arrival as well. Her serum glucose was 119 on presentation and she can recall feeling weak but does not recall slurred speech. She does not have a leukocytosis and denies fevers but does report recent hx of URI that she believes she got while at [facility]. Thus infectious etiology could have caused her weakness. UA was negative. Her TSH was elevated last month and she reports her synthroid dose was increased to account for this. Her UDS was + for opiates which are not on her med list which could cause hypotension, will need to ask pt tomorrow morning if she takes anything for  pain. - admit to tele  - asa  - portable CXR - checking vitamin B12 and TSH - NS at 100cc/hr x 12 hours - repeat orthostatic vitals in the morning - PT consulted  2. AKI-- Creatinine 1.02 on admission, previously 0.7 in Mar 14, 2016. Likely pre renal from decrease intake and ACEinh. BUN/Cr >20. Could also be due to levaquin as she was on  qd which should  have been renally dosed to q48 hours. Her CrCl on admission is 29.5 and on 5/6 it CrCl was 42 which still requires renal dosing. - IVF as #1 - stop lisinopril - checking FENa  3. HTN-- BP elevated around SBP 150's. She was started on lisinopril 5mg  last month for newly dx HTN. Likely her BP does not need to be tightly controlled due to sx of orthostatic hypotension.  - stop lisinopril, consider norvasc 2.5mg  if BP >150-160 as she has hx of stroke  4. Hypothyroidism-- continue synthroid , repeat TSH in the am.   5. Anxiety and Insomnia- her mood is stable, no SI/HI - continue home trazodone 50mg  qhs and celexa 20mg  qd  6. Normocytic anemia-- hgb 10.9, MCV 96.6. Previously on 5/6 her hgb was 11.1 Denies hematochezia and melena. Could be due to early onset iron deficiency, declining renal function, hypothyroidism, and thalasemia minor train.  - checking ferritin level  7. CKD stage IIIa-- GFR 48, likely decreased from >60 due to AKI. Repeat CMET in the morning after IVF hydration.   DVT- renally dosed lovenox  CODE-- FULL, confirmed w/ patient   Dispo: Disposition is deferred at this time, awaiting improvement of current medical problems.    The patient does have a current PCP (No primary care provider on file.) and does not need an [facility] hospital follow-up appointment after discharge.  The patient does not have transportation limitations that hinder transportation to clinic appointments.  Signed: Denton Brick, MD 04/16/2016, 5:38 PM

## 2016-04-17 DIAGNOSIS — R29898 Other symptoms and signs involving the musculoskeletal system: Secondary | ICD-10-CM | POA: Diagnosis not present

## 2016-04-17 DIAGNOSIS — R531 Weakness: Secondary | ICD-10-CM | POA: Diagnosis not present

## 2016-04-17 LAB — BASIC METABOLIC PANEL
Anion gap: 7 (ref 5–15)
BUN: 14 mg/dL (ref 6–20)
CALCIUM: 7.8 mg/dL — AB (ref 8.9–10.3)
CO2: 24 mmol/L (ref 22–32)
CREATININE: 0.84 mg/dL (ref 0.44–1.00)
Chloride: 109 mmol/L (ref 101–111)
GFR calc Af Amer: >60 mL/min (ref >60)
GLUCOSE: 96 mg/dL (ref 65–99)
Potassium: 3.6 mmol/L (ref 3.5–5.1)
Sodium: 140 mmol/L (ref 135–145)

## 2016-04-17 MED ORDER — LEVOTHYROXINE SODIUM 88 MCG PO TABS: 88.0000 ug | ORAL_TABLET | Freq: Every day | ORAL | Status: DC

## 2016-04-17 MED ORDER — LEVOTHYROXINE SODIUM 88 MCG PO TABS: 88.0000 ug | ORAL_TABLET | Freq: Every day | ORAL | Status: AC

## 2016-04-17 NOTE — Progress Notes (Signed)
Subjective: Pt states she feels much better this morning. She is was told that she would be going to a rehab facility and is open to this idea. She only took asa for pain prior to admission.   Objective: Vital signs in last 24 hours: Filed Vitals:   04/16/16 2000 04/16/16 2100 04/16/16 2208 04/17/16 0554  BP: 142/52 144/52 168/51 148/45  Pulse: 73 74 74 67  Temp:   98 F (36.7 C) 97.9 F (36.6 C)  TempSrc:    Oral  Resp: 13 13 19 18   Height:      Weight:      SpO2: 99% 98% 99% 97%   Weight change:   Intake/Output Summary (Last 24 hours) at 04/17/16 1146 Last data filed at 04/17/16 0835  Gross per 24 hour  Intake      0 ml  Output   1800 ml  Net  -1800 ml   General: NAD, sitting in recliner at bedside Lungs: CTAB, no wheezing or crackles Cardiac: RRR, no murmurs, rubs, gallops GI: soft, active bowel sounds, non TTP Neuro: CN II-XII grossly intact, +4/5 RLE planter and dorsiflexion w/ 5/5 on opposite side.  Skin: warm and dry    Lab Results: Basic Metabolic Panel:  Recent Labs Lab 04/16/16 1048 04/16/16 1101 04/17/16 0520  NA 135 135 140  K 3.6 3.6 3.6  CL 101 99* 109  CO2 25  --  24  GLUCOSE 91 86 96  BUN 19 22* 14  CREATININE 1.02* 1.00 0.84  CALCIUM 8.7*  --  7.8*   Liver Function Tests:  Recent Labs Lab 04/16/16 1048  AST 20  ALT 16  ALKPHOS 38  BILITOT 0.7  PROT 6.3*  ALBUMIN 3.2*   CBC:  Recent Labs Lab 04/16/16 1048 04/16/16 1101  WBC 7.4  --   NEUTROABS 5.4  --   HGB 10.3* 10.9*  HCT 31.1* 32.0*  MCV 96.6  --   PLT 327  --    Thyroid Function Tests:  Recent Labs Lab 04/16/16 1850  TSH 9.139*   Anemia Panel:  Recent Labs Lab 04/16/16 1850  VITAMINB12 910  FERRITIN 667*   Urine Drug Screen: Drugs of Abuse     Component Value Date/Time   LABOPIA POSITIVE* 04/16/2016 1208   COCAINSCRNUR NONE DETECTED 04/16/2016 1208   LABBENZ NONE DETECTED 04/16/2016 1208   AMPHETMU NONE DETECTED 04/16/2016 1208   THCU NONE  DETECTED 04/16/2016 1208   LABBARB NONE DETECTED 04/16/2016 1208    Alcohol Level:  Recent Labs Lab 04/16/16 1048  ETH <5   Urinalysis:  Recent Labs Lab 04/16/16 1208  COLORURINE YELLOW  LABSPEC 1.013  PHURINE 7.5  GLUCOSEU NEGATIVE  HGBUR NEGATIVE  BILIRUBINUR NEGATIVE  KETONESUR NEGATIVE  PROTEINUR NEGATIVE  NITRITE NEGATIVE  LEUKOCYTESUR NEGATIVE   Studies/Results: Ct Head Wo Contrast  04/16/2016  CLINICAL DATA:  Allergic reaction in syncopal episode approximately 1 week ago. Concern for TIA. EXAM: CT HEAD WITHOUT CONTRAST TECHNIQUE: Contiguous axial images were obtained from the base of the skull through the vertex without intravenous contrast. COMPARISON:  04/09/2016 FINDINGS: Brain: Similar findings of advanced atrophy with sulcal prominence centralized volume loss with commensurate ex vacuo dilatation of the ventricular system. Extensive periventricular hypodensities compatible microvascular ischemic disease. Old lacunar infarct within the bilateral insular cortices, right greater left (representative image 11, series 2). Given extensive background parenchymal abnormalities, there is no CT evidence of superimposed acute large territory infarct. No intraparenchymal or extra-axial mass or hemorrhage. Unchanged  size and configuration of the ventricles and basilar cisterns. No midline shift. Vascular: Intracranial atherosclerosis. Skull: Negative for fracture or focal lesion. Sinuses/Orbits: Minimally mucosal thickening involving the bilateral sphenoid sinuses. The remaining paranasal sinuses and mastoid air cells are normally aerated. No air-fluid levels. Other: Regional soft tissues appear normal. IMPRESSION: Similar findings of advanced atrophy and mild microvascular ischemic disease without acute intracranial process. Electronically Signed   By: Simonne Come M.D.   On: 04/16/2016 11:30   Mr Shirlee Latch Wo Contrast  04/16/2016  CLINICAL DATA:  82 year old female with depression and  altered mental status. Weakness. Syncopal episode 1 week ago. Subsequent encounter. EXAM: MRI HEAD WITHOUT CONTRAST MRA HEAD WITHOUT CONTRAST TECHNIQUE: Multiplanar, multiecho pulse sequences of the brain and surrounding structures were obtained without intravenous contrast. Angiographic images of the head were obtained using MRA technique without contrast. COMPARISON:  04/16/2016 head CT. FINDINGS: MRI HEAD FINDINGS No acute infarct or intracranial hemorrhage. Remote small mid right coronal radiata infarct. Remote tiny right cerebellar infarct. Mild to slightly moderate chronic microvascular changes. Incidentally noted are prominent perivascular spaces in the basal ganglia. Mild global atrophy without hydrocephalus. No intracranial mass lesion noted on this unenhanced exam. Post lens replacement otherwise orbital structures unremarkable. Cervical medullary junction within normal limits. Straightening of the cervical spine with mild degenerative changes without high-grade stenosis. Partially empty non expanded sella incidentally noted. Minimal mucosal thickening ethmoid sinus air cells, right maxillary sinus and right sphenoid sinus. MRA HEAD FINDINGS Exam is motion degraded. This limits evaluation for evaluating intracranial atherosclerotic type changes and for detecting aneurysm. All that can be stated with certainty on the present examination is that there is flow within portions of the internal carotid arteries, vertebral arteries and basilar artery. Findings are suspicious for intracranial atherosclerotic changes with medium and small size vessel stenosis. Bulge left lateral aspect of the basilar tip may be related to artifact/atherosclerotic type changes rather than aneurysm. IMPRESSION: MRI HEAD No acute infarct or intracranial hemorrhage. Remote small mid right coronal radiata infarct. Remote tiny right cerebellar infarct. Mild to slightly moderate chronic microvascular changes. Mild global atrophy without  hydrocephalus. No intracranial mass lesion noted on this unenhanced exam. MRA HEAD Exam is motion degraded. This limits evaluation for evaluating intracranial atherosclerotic type changes and for detecting aneurysm. All that can be stated with certainty on the present examination is that there is flow within portions of the internal carotid arteries, vertebral arteries and basilar artery. Findings are suspicious for intracranial atherosclerotic changes with medium and small size vessel stenosis. Electronically Signed   By: Lacy Duverney M.D.   On: 04/16/2016 15:37   Mr Brain Wo Contrast  04/16/2016  CLINICAL DATA:  82 year old female with depression and altered mental status. Weakness. Syncopal episode 1 week ago. Subsequent encounter. EXAM: MRI HEAD WITHOUT CONTRAST MRA HEAD WITHOUT CONTRAST TECHNIQUE: Multiplanar, multiecho pulse sequences of the brain and surrounding structures were obtained without intravenous contrast. Angiographic images of the head were obtained using MRA technique without contrast. COMPARISON:  04/16/2016 head CT. FINDINGS: MRI HEAD FINDINGS No acute infarct or intracranial hemorrhage. Remote small mid right coronal radiata infarct. Remote tiny right cerebellar infarct. Mild to slightly moderate chronic microvascular changes. Incidentally noted are prominent perivascular spaces in the basal ganglia. Mild global atrophy without hydrocephalus. No intracranial mass lesion noted on this unenhanced exam. Post lens replacement otherwise orbital structures unremarkable. Cervical medullary junction within normal limits. Straightening of the cervical spine with mild degenerative changes without high-grade stenosis. Partially empty non  expanded sella incidentally noted. Minimal mucosal thickening ethmoid sinus air cells, right maxillary sinus and right sphenoid sinus. MRA HEAD FINDINGS Exam is motion degraded. This limits evaluation for evaluating intracranial atherosclerotic type changes and for  detecting aneurysm. All that can be stated with certainty on the present examination is that there is flow within portions of the internal carotid arteries, vertebral arteries and basilar artery. Findings are suspicious for intracranial atherosclerotic changes with medium and small size vessel stenosis. Bulge left lateral aspect of the basilar tip may be related to artifact/atherosclerotic type changes rather than aneurysm. IMPRESSION: MRI HEAD No acute infarct or intracranial hemorrhage. Remote small mid right coronal radiata infarct. Remote tiny right cerebellar infarct. Mild to slightly moderate chronic microvascular changes. Mild global atrophy without hydrocephalus. No intracranial mass lesion noted on this unenhanced exam. MRA HEAD Exam is motion degraded. This limits evaluation for evaluating intracranial atherosclerotic type changes and for detecting aneurysm. All that can be stated with certainty on the present examination is that there is flow within portions of the internal carotid arteries, vertebral arteries and basilar artery. Findings are suspicious for intracranial atherosclerotic changes with medium and small size vessel stenosis. Electronically Signed   By: Lacy DuverneySteven  Olson M.D.   On: 04/16/2016 15:37   Dg Chest Port 1 View  04/16/2016  CLINICAL DATA:  Upper respiratory infection with dizziness and weakness today. EXAM: PORTABLE CHEST 1 VIEW COMPARISON:  04/09/2016 FINDINGS: Borderline cardiomegaly remains stable as well as ectasia and atherosclerotic calcification of the thoracic aorta. Both lungs remain clear. No evidence of pneumothorax or pleural effusion. IMPRESSION: Stable exam.  No active disease. Electronically Signed   By: Myles RosenthalJohn  Stahl M.D.   On: 04/16/2016 18:18   Medications: I have reviewed the patient's current medications. Scheduled Meds: . aspirin EC  81 mg Oral Daily  . citalopram  20 mg Oral Daily  . docusate sodium  100 mg Oral q morning - 10a  . enoxaparin (LOVENOX)  injection  30 mg Subcutaneous Q24H  . [START ON 04/18/2016] levothyroxine  88 mcg Oral QAC breakfast  . mometasone-formoterol  2 puff Inhalation BID   Continuous Infusions:  PRN Meds:.acetaminophen **OR** acetaminophen, albuterol, traZODone Assessment/Plan: Principal Problem:   Leg weakness, bilateral Active Problems:   Orthostatic hypotension   Hypothyroidism   History of CVA (cerebrovascular accident)   Anemia   AKI (acute kidney injury) (HCC)   Anxiety   Insomnia  1. Lower extremity weakness-- Head CT, MRI/MRA negative for acute stroke. Her weakness has improved w/ IVF. Neurology has signed off. Vitamin B12 wnl and CXR negative for infection. Her TSH was elevated at 9.139 thus her LE weakness is likely multifactorial from hypothyroidism and orthostatic hypotension w/ recent blood pressure lowering therapy.  - asa 81mg  - stop lisinopril, allow elevated SBP around 150-160  - PT consulted  2. AKI-- resolving, creatinine today is 0.84 from 1.02 on admission, previously 0.7 in Mar 14, 2016. Her FENa suggest an intrinsic source of AKI likely from levaquin use along poor po intake w/ ACEinh use.  - will recommend repeat BMET as outpatient and renally dosed medications.   3. HTN-- BP elevated around SBP 130-168 overnight. Her BP goal should be increased to around 150-160 w/ sx of orthostatic hypotension.  - stopped lisinopril, consider norvasc 2.5mg  if BP >150-160 as she has hx of stroke  4. Hypothyroidism-- TSH elevated at 9.139, per pharmacy pt has been on synthroid 75mcg daily without any recent dose increases. Pt reports she takes synthroid  every morning before breakfast.  - increase synthroid to  5. Anxiety and Insomnia- her mood is stable, no SI/HI - continue home trazodone 50mg  qhs and celexa 20mg  qd  6. Normocytic anemia-- hgb 10.9, MCV 96.6. Previously on 5/6 her hgb was 11.1 Ferritin was 667. Anemia could be due to declining renal function, hypothyroidism, and thalasemia  minor trait.  - CTM  DVT- renally dosed lovenox  CODE-- FULL, confirmed w/ patient   Dispo: Disposition pending PT recommendations, pt is open to SNF and her neighbors have expressed concerns of weakness and increased wall walking.    The patient does have a current PCP (No primary care provider on file.) and does not need an [facility] hospital follow-up appointment after discharge.  The patient does not have transportation limitations that hinder transportation to clinic appointments.  .Services Needed at time of discharge: Y = Yes, Blank = No PT:   OT:   RN:   Equipment:   Other:       Denton Brick, MD 04/17/2016, 11:46 AM

## 2016-04-17 NOTE — Progress Notes (Signed)
Subjective: No acute complaints overnight.  Patient's weakness has improved. Patient feels like she is at baseline.  Mild intermittent cough persists but has improved as well. No issues with sleep.  Eating well.   Objective: Vital signs in last 24 hours: Filed Vitals:   04/16/16 2000 04/16/16 2100 04/16/16 2208 04/17/16 0554  BP: 142/52 144/52 168/51 148/45  Pulse: 73 74 74 67  Temp:   98 F (36.7 C) 97.9 F (36.6 C)  TempSrc:    Oral  Resp: Height:      Weight:      SpO2: 99% 98% 99% 97%   Weight change:   Intake/Output Summary (Last 24 hours) at 04/17/16 1329 Last data filed at 04/17/16 1217  Gross per 24 hour  Intake    360 ml  Output   2000 ml  Net  -1640 ml   Physical Exam  Constitutional: She is cooperative.  Non-toxic appearance. No distress.  Cardiovascular: Normal rate, regular rhythm and normal heart sounds.   Pulmonary/Chest: Effort normal and breath sounds normal. No respiratory distress.  Abdominal: Bowel sounds are normal.  Musculoskeletal:  hypothyroidism and CVA with right sided deficits that presents due to family concern for stroke.  Neurological: She is alert.  Right lower facial drop   Lab Results: Basic Metabolic Panel:  Recent Labs  16/10/96 1048 04/16/16 1101 04/17/16 0520  NA 135 135 140  K 3.6 3.6 3.6  CL 101 99* 109  CO2 25  --  24  GLUCOSE 91 86 96  BUN 19 22* 14  CREATININE 1.02* 1.00 0.84  CALCIUM 8.7*  --  7.8*   Liver Function Tests:  Recent Labs  04/16/16 1048  AST 20  ALT 16  ALKPHOS 38  BILITOT 0.7  PROT 6.3*  ALBUMIN 3.2*   CBC:  Recent Labs  04/16/16 1048 04/16/16 1101  WBC 7.4  --   NEUTROABS 5.4  --   HGB 10.3* 10.9*  HCT 31.1* 32.0*  MCV 96.6  --   PLT 327  --    Thyroid Function Tests:  Recent Labs  04/16/16 1850  TSH 9.139*   Anemia Panel:  Recent Labs  04/16/16 1850  VITAMINB12 910  FERRITIN 667*   Urine Drug Screen: Drugs of Abuse     Component Value Date/Time   LABOPIA POSITIVE* 04/16/2016 1208   COCAINSCRNUR NONE DETECTED 04/16/2016 1208   LABBENZ NONE DETECTED 04/16/2016 1208   AMPHETMU NONE DETECTED 04/16/2016 1208   THCU NONE DETECTED 04/16/2016 1208   LABBARB NONE DETECTED 04/16/2016 1208     Urinalysis:  Recent Labs  04/16/16 1208  COLORURINE YELLOW  LABSPEC 1.013  PHURINE 7.5  GLUCOSEU NEGATIVE  HGBUR NEGATIVE  BILIRUBINUR NEGATIVE  KETONESUR NEGATIVE  PROTEINUR NEGATIVE  NITRITE NEGATIVE  LEUKOCYTESUR NEGATIVE   Studies/Results: No new labs  Medications:  Scheduled Meds: . aspirin EC  81 mg Oral Daily  . citalopram  20 mg Oral Daily  . docusate sodium  100 mg Oral q morning - 10a  . enoxaparin (LOVENOX) injection  30 mg Subcutaneous Q24H  . [START ON 04/18/2016] levothyroxine  88 mcg Oral QAC breakfast  . mometasone-formoterol  2 puff Inhalation BID   Continuous Infusions:  PRN Meds:.acetaminophen **OR** acetaminophen, albuterol, traZODone   Problem List Principal Problem:   Leg weakness, bilateral Active Problems:   Orthostatic hypotension   Hypothyroidism   History of CVA (cerebrovascular accident)   Anemia   AKI (acute kidney injury) (HCC)  Anxiety   Insomnia  Assessment/Plan: Ms. Devito is a 82 year old female with history of hypothyroidism and CVA with residual right sided deficits that presented with bilateral lower extremity weakness.    Bilateral Leg Weakness--Improved: Weakness has improved after IV fluids overnight, patient at baseline. Etiology likely multifactorial.  Differential includes infection vs. orthostatic hypotension vs. dehydration vs. hypothyroidism vs. medication induced vs. metabolic etiology.  Patient recovering from URI for the past 3 weeks which can be a contributing factor to her presentation. There was was concern for progression or indolent infection but patient is afebrile, nontoxic appearing with a normal WBC make this unlikely. Negative CXR and UA are also reassuring signs.   Patient seen by Neurology who suspected orthostatic hypotension.  Orthostatic vitals were normal but performed after IV fluid administration.   UDS found to be positive for opiates which is associated with both orthostatic hypotension and weakness. Per pharm, patient was last filled 90 tablet Norco prescribed in March although patient denies recent use. Cardiac pump abnormalites including arrythmias are also associated with orthostatic hypotension but less likely due to negative Telemetry readings.  Vitamin B12 level was normal ruling its deficiency out as a contributing factor to presentation. Physical Therapy evaluated patient today and suggests SNF placement. -- d/c telemetry -- Consult Social work for SNF placement  Acute Kidney Injury--improved: Cr 0.84 and BUN 14 improved today (Cr 1.00 and BUN 22 on admission). Differential includes prerenal vs. intrarenal etiology.  BUN/Cr ratio on admission and improved ratio after overnight fluids  supports orthostatic induced prerenal etiology.   Inappropriately dosed Levaquin (dose not adjusted for renal impairment) along with a calculated FeNA of 2.35% supports intrarenal etiology. -- d/c levaquin -- follow up with PCP for evaluation with renal function panel  Hypertension-improved: Recently diagnosed with HTN and treated with Lisinopril 5mg  3 weeks ago. Hypertensive to 170s upon admission. Blood pressure possibly a compensatory response to orthostatic hypotension and tight control not necessary.  Lisinopril held.  Blood pressure ranged in the 140s/50s overnight with an isolated elevation of 168/51.   -- discontinuation of Lisinopril recommended upon discharge -- consider starting Norvasc if blood pressure consistently elevated -- follow up with PCP for evaluation  Normocytic Anemia:Hb: 10.3, HCT: 31.1,  MCV: 96.6 on admission. Ferritin level found to be 667. Differential includes  hemoglobinopathies (Thalasemias minor) vs. Malnutrition vs. age related  decline in renal function.   Ferritin levels in this patient may be increased because of poor nutrition vs. an underlying inflammatory process as ferritin is an acute phase reactant.  -- follow up with PCP for evaluation  Hypothyroidism- uncontrolled:  TSH level is 9.139.  Last recorded TSH level on 04/2016 was 9.393.  Patient has taken Synthroid since 2012. Elevated TSH level suggests that hypothyroidism is uncontrolled and Synthroid dose should be uptitrated -- increase home Synthroid to daily -- follow-up with PCP in 4-6 weeks for reevaluation of Thyroid function  Depression/Anxiety- stable, no SI/HI on admission: Admitted to behavioral health 1 month ago for SI as she grieved the loss of her husband.  -- continue home celexa 20mg  daily -- continue home Trazodone 50mg  at bedtime  PPX -- DVT: Lovenox -- Fall precautions  Code status: FULL CODE  Dispo: Today pending SNF placement  This is a Psychologist, occupational Note.  The care of the patient was discussed with Dr. Danella Penton and the assessment and plan formulated with their assistance.  Please see their attached note for official documentation of the daily encounter.  Danelle Earthlyaryl Marcos Ruelas, Med Student 04/17/2016, 1:29 PM

## 2016-04-17 NOTE — Clinical Social Work Placement (Signed)
   CLINICAL SOCIAL WORK PLACEMENT  NOTE  Date:  04/17/2016  Patient Details  Name: Deanna Cross MRN: [id number] Date of Birth: Dec 22, 1925  Clinical Social Work is seeking post-discharge placement for this patient at the Skilled  Nursing Facility level of care (*CSW will initial, date and re-position this form in  chart as items are completed):  Yes   Patient/family provided with [facility] Clinical Social Work Department's list of facilities offering this level of care within the geographic area requested by the patient (or if unable, by the patient's family).  Yes   Patient/family informed of their freedom to choose among providers that offer the needed level of care, that participate in Medicare, Medicaid or managed care program needed by the patient, have an available bed and are willing to accept the patient.  Yes   Patient/family informed of [facility]'s ownership interest in [facility] and [facility], as well as of the fact that they are under no obligation to receive care at these facilities.  PASRR submitted to EDS on       PASRR number received on       Existing PASRR number confirmed on 04/17/16     FL2 transmitted to all facilities in geographic area requested by pt/family on 04/17/16     FL2 transmitted to all facilities within larger geographic area on       Patient informed that his/her managed care company has contracts with or will negotiate with certain facilities, including the following:        Yes   Patient/family informed of bed offers received.  [facility]     Physician recommends and patient chooses bed at      [facility].  Patient to be transferred to facility by [facility] by car     Patient family notified on 04/17/16 of transfer.  Name of family member notified:  [facility]     PHYSICIAN Please sign FL2     Additional Comment:     _______________________________________________ Mearl LatinNadia S Shakedra Beam, LCSWA 04/17/2016, 3:37 PM

## 2016-04-17 NOTE — Progress Notes (Signed)
Pharmacist Student Provided - Patient Medication Education Education Best boyof B1 [facility] Team Teaching Service Patient  Education: Educated the patient on some of current medications, specifically enoxaparin and levothyroxine. Reviewed side effects with the patient. Answered the patient's medication specific questions. These medications are on the patient's current med list and are subject to change upon discharge.  Dola Argyleamika Adebayo Ensminger  P4 PharmD Candidate

## 2016-04-17 NOTE — Discharge Summary (Signed)
Name: Deanna Cross MRN: [id number] DOB: 12-28-1925 82 y.o. PCP: No primary care provider on file.  Date of Admission: 04/16/2016 10:06 AM Date of Discharge: 04/17/2016 Attending Physician: Inez CatalinaEmily B Mullen, MD  Discharge Diagnosis: Principal Problem:   Leg weakness, bilateral Active Problems:   Orthostatic hypotension   Hypothyroidism   History of CVA (cerebrovascular accident)   Anemia   AKI (acute kidney injury) (HCC)   Anxiety   Insomnia  Discharge Medications:   Medication List    STOP taking these medications        levofloxacin 750 MG tablet  Commonly known as:  LEVAQUIN      TAKE these medications        ADVAIR DISKUS 250-50 MCG/DOSE Aepb  Generic drug:  Fluticasone-Salmeterol  Inhale 1 puff into the lungs 2 (two) times daily.     aspirin EC 81 MG tablet  Take 81 mg by mouth daily.     CALTRATE 600+D 600-800 MG-UNIT Tabs  Generic drug:  Calcium Carb-Cholecalciferol  Take 1 tablet by mouth daily.     citalopram 20 MG tablet  Commonly known as:  CELEXA  Take 20 mg by mouth daily.     docusate sodium 100 MG capsule  Commonly known as:  COLACE  Take 100 mg by mouth every morning.     levothyroxine 88 MCG tablet  Commonly known as:  SYNTHROID, LEVOTHROID  Take 1 tablet (88 mcg total) by mouth daily before breakfast.  Start taking on:  04/18/2016     mineral/vitamin supplement 70 MG Tabs tablet  Take 1 tablet by mouth daily.     omeprazole 20 MG capsule  Commonly known as:  PRILOSEC  Take 20 mg by mouth 2 (two) times daily.     pravastatin 20 MG tablet  Commonly known as:  PRAVACHOL  Take 20 mg by mouth every evening.     PROAIR HFA 108 (90 Base) MCG/ACT inhaler  Generic drug:  albuterol  Inhale 2 puffs into the lungs every 6 (six) hours as needed for wheezing or shortness of breath.     raloxifene 60 MG tablet  Commonly known as:  EVISTA  Take 60 mg by mouth daily.     traZODone 50 MG tablet  Commonly known as:  DESYREL  Take 50 mg by mouth at  bedtime as needed for sleep.        Disposition and follow-up:   Deanna Cross [facility].  At the hospital follow up visit please address:  1.  Please repeat TSH in 4-6 weeks.   2.  Labs / imaging needed at time of follow-up: BMET  3.  Pending labs/ test needing follow-up: none  4. Please make sure all medications given are renally dosed.   5. Her BP med lisinopril was stopped due to AKI and orthostatic hypotension, can consider low dose norvasc if BP elevated.     Procedures Performed:  Ct Head Wo Contrast  04/16/2016  CLINICAL DATA:  Allergic reaction in syncopal episode approximately 1 week ago. Concern for TIA. EXAM: CT HEAD WITHOUT CONTRAST TECHNIQUE: Contiguous axial images were obtained from the base of the skull through the vertex without intravenous contrast. COMPARISON:  04/09/2016 FINDINGS: Brain: Similar findings of advanced atrophy with sulcal prominence centralized volume loss with commensurate ex vacuo dilatation of the ventricular system. Extensive periventricular hypodensities compatible microvascular ischemic disease. Old lacunar infarct within the bilateral insular cortices, right greater left (representative image 11,  series 2). Given extensive background parenchymal abnormalities, there is no CT evidence of superimposed acute large territory infarct. No intraparenchymal or extra-axial mass or hemorrhage. Unchanged size and configuration of the ventricles and basilar cisterns. No midline shift. Vascular: Intracranial atherosclerosis. Skull: Negative for fracture or focal lesion. Sinuses/Orbits: Minimally mucosal thickening involving the bilateral sphenoid sinuses. The remaining paranasal sinuses and mastoid air cells are normally aerated. No air-fluid levels. Other: Regional soft tissues appear normal. IMPRESSION: Similar findings of advanced atrophy and mild microvascular ischemic disease without acute intracranial  process. Electronically Signed   By: Simonne Come M.D.   On: 04/16/2016 11:30   Mr Shirlee Latch Wo Contrast  04/16/2016  CLINICAL DATA:  82 year old female with depression and altered mental status. Weakness. Syncopal episode 1 week ago. Subsequent encounter. EXAM: MRI HEAD WITHOUT CONTRAST MRA HEAD WITHOUT CONTRAST TECHNIQUE: Multiplanar, multiecho pulse sequences of the brain and surrounding structures were obtained without intravenous contrast. Angiographic images of the head were obtained using MRA technique without contrast. COMPARISON:  04/16/2016 head CT. FINDINGS: MRI HEAD FINDINGS No acute infarct or intracranial hemorrhage. Remote small mid right coronal radiata infarct. Remote tiny right cerebellar infarct. Mild to slightly moderate chronic microvascular changes. Incidentally noted are prominent perivascular spaces in the basal ganglia. Mild global atrophy without hydrocephalus. No intracranial mass lesion noted on this unenhanced exam. Post lens replacement otherwise orbital structures unremarkable. Cervical medullary junction within normal limits. Straightening of the cervical spine with mild degenerative changes without high-grade stenosis. Partially empty non expanded sella incidentally noted. Minimal mucosal thickening ethmoid sinus air cells, right maxillary sinus and right sphenoid sinus. MRA HEAD FINDINGS Exam is motion degraded. This limits evaluation for evaluating intracranial atherosclerotic type changes and for detecting aneurysm. All that can be stated with certainty on the present examination is that there is flow within portions of the internal carotid arteries, vertebral arteries and basilar artery. Findings are suspicious for intracranial atherosclerotic changes with medium and small size vessel stenosis. Bulge left lateral aspect of the basilar tip may be related to artifact/atherosclerotic type changes rather than aneurysm. IMPRESSION: MRI HEAD No acute infarct or intracranial hemorrhage.  Remote small mid right coronal radiata infarct. Remote tiny right cerebellar infarct. Mild to slightly moderate chronic microvascular changes. Mild global atrophy without hydrocephalus. No intracranial mass lesion noted on this unenhanced exam. MRA HEAD Exam is motion degraded. This limits evaluation for evaluating intracranial atherosclerotic type changes and for detecting aneurysm. All that can be stated with certainty on the present examination is that there is flow within portions of the internal carotid arteries, vertebral arteries and basilar artery. Findings are suspicious for intracranial atherosclerotic changes with medium and small size vessel stenosis. Electronically Signed   By: Lacy Duverney M.D.   On: 04/16/2016 15:37   Mr Brain Wo Contrast  04/16/2016  CLINICAL DATA:  82 year old female with depression and altered mental status. Weakness. Syncopal episode 1 week ago. Subsequent encounter. EXAM: MRI HEAD WITHOUT CONTRAST MRA HEAD WITHOUT CONTRAST TECHNIQUE: Multiplanar, multiecho pulse sequences of the brain and surrounding structures were obtained without intravenous contrast. Angiographic images of the head were obtained using MRA technique without contrast. COMPARISON:  04/16/2016 head CT. FINDINGS: MRI HEAD FINDINGS No acute infarct or intracranial hemorrhage. Remote small mid right coronal radiata infarct. Remote tiny right cerebellar infarct. Mild to slightly moderate chronic microvascular changes. Incidentally noted are prominent perivascular spaces in the basal ganglia. Mild global atrophy without hydrocephalus. No intracranial mass lesion noted on this unenhanced exam. Post lens  replacement otherwise orbital structures unremarkable. Cervical medullary junction within normal limits. Straightening of the cervical spine with mild degenerative changes without high-grade stenosis. Partially empty non expanded sella incidentally noted. Minimal mucosal thickening ethmoid sinus air cells, right  maxillary sinus and right sphenoid sinus. MRA HEAD FINDINGS Exam is motion degraded. This limits evaluation for evaluating intracranial atherosclerotic type changes and for detecting aneurysm. All that can be stated with certainty on the present examination is that there is flow within portions of the internal carotid arteries, vertebral arteries and basilar artery. Findings are suspicious for intracranial atherosclerotic changes with medium and small size vessel stenosis. Bulge left lateral aspect of the basilar tip may be related to artifact/atherosclerotic type changes rather than aneurysm. IMPRESSION: MRI HEAD No acute infarct or intracranial hemorrhage. Remote small mid right coronal radiata infarct. Remote tiny right cerebellar infarct. Mild to slightly moderate chronic microvascular changes. Mild global atrophy without hydrocephalus. No intracranial mass lesion noted on this unenhanced exam. MRA HEAD Exam is motion degraded. This limits evaluation for evaluating intracranial atherosclerotic type changes and for detecting aneurysm. All that can be stated with certainty on the present examination is that there is flow within portions of the internal carotid arteries, vertebral arteries and basilar artery. Findings are suspicious for intracranial atherosclerotic changes with medium and small size vessel stenosis. Electronically Signed   By: Lacy Duverney M.D.   On: 04/16/2016 15:37   Dg Chest Port 1 View  04/16/2016  CLINICAL DATA:  Upper respiratory infection with dizziness and weakness today. EXAM: PORTABLE CHEST 1 VIEW COMPARISON:  04/09/2016 FINDINGS: Borderline cardiomegaly remains stable as well as ectasia and atherosclerotic calcification of the thoracic aorta. Both lungs remain clear. No evidence of pneumothorax or pleural effusion. IMPRESSION: Stable exam.  No active disease. Electronically Signed   By: Myles Rosenthal M.D.   On: 04/16/2016 18:18    Admission HPI: Ms. Benthall is a pleasant 69F w/ PMHx of  CVA w/ residual rt sided weakness, newly dx HTN, hypothyroidism, GERD, depression, and HLD who presented w/ b/l weakness while at home. She is accompanied by her 2 neighbors in the exam room who are very involved in her care. She noticed this morning that she was not able to get up from her chair and that the room was spinning after she sat back down. She then called her neighbor who noted that she had slurred speech on the phone. On arrival to her house the neighbor saw that the patient was slumped over to the left side, tremulous, disoriented, and weak. She wears briefs due to difficulty ambulating to the bathroom but denies urinary or bowel incontinence today. Prior to episode of weakness she was last seen normal 2 days ago. On EMS arrival her speech was repetitive which improved during transport. She has been treated for an URI 2 weeks ago w/ rocephin and kenalog resulting in an allergic rxn during which patient passed out at the physicians office. She reports her URI sx did not go away and she was given a course of levaquin. She is still complaining of a cough though reports it has improved. Her cough is productive of clear/white sputum. She states she has a good appetite and eats 3 meals a day with adequate fluid intake although her neighbors disagree.   Of note she was recently hospitalized for depression w/ SI last month at [facility]. During that time she was dx with hypertension and started on lisinopril . She denies SI, HI, and auditory hallucinations. She  does state that sometimes she will see things at the periphery of her vision and then turn around to find that nothing is there. She reports her mood is much better than before. She lives alone and is independent w/ ADLs. She uses a walker to ambulate but her neighbors have noticed that she has been wall walking more, they are afraid that she has been getting more unsteady on her feet. She has hypothyroidism and her TSH last month was elevated at 9.393,  she reports that her synthroid was adjusted at that time.   In the ED a CT head negative for bleed, MRI/MRA negative for acute infarct. Troponin negative. UA negative for infection. She was started on IVFs and states her weakness has improved. Her UDS was positive for opiates where are not on her med list. Neurology saw patient in the ED and recommended orthostatic vitals w/ gentle hydration.   Meds:  Hospital Course by problem list: Principal Problem:   Leg weakness, bilateral Active Problems:   Orthostatic hypotension   Hypothyroidism   History of CVA (cerebrovascular accident)   Anemia   AKI (acute kidney injury) (HCC)   Anxiety   Insomnia   1. Lower extremity weakness-- Head CT, MRI/MRA negative for acute stroke. Her weakness has improved w/ IVF and she felt better on day of discharge. Neurology was consulted and felt that her symptoms were due to orthostatic hypotension. Vitamin B12 wnl and CXR negative for infection. Her TSH was elevated at 9.139 thus her LE weakness is likely multifactorial from hypothyroidism and orthostatic hypotension w/ recent blood pressure lowering therapy. Physical therapy saw patient and recommend SNF.   2. Acute Kidney Injury- Creatinine 1.02 on admission, previously 0.7 in Mar 14, 2016. Her FENa suggests an intrinsic cause of AKI likely from levaquin that was not renally dosed, along with poor po intake in setting of ACEinh use. Her creatinine improved to 0.84 on the day of discharge. Can repeat BMET as outpatient.   3. HTN-- She was started on lisinopril 5mg  last month for newly dx HTN. Likely her BP does not need to be tightly controlled due to sx of orthostatic hypotension. Lisinopril was stopped.    4. Hypothyroidism--TSH elevated at 9.139 this admission, per pharmacy pt has been on synthroid daily without any recent dose increases despite elevated TSH level of 9.393 on 03/14/16 . Pt reports she takes synthroid every morning before breakfast. Her  synthroid was increased to . Recommend repeat TSH in 4-6 weeks.   6. Normocytic anemia-- On admission her hgb 10.9, MCV 96.6. Previously on 5/6 her hgb was 11.1 Denies hematochezia and melena. DDx include declining renal function, hypothyroidism, and thalasemia minor trait. Her ferritin level was elevated at 667.  Discharge Vitals:   BP 94/28 mmHg  Pulse 73  Temp(Src) 98 F (36.7 C) (Oral)  Resp 18  Ht 5\' 3"  (1.6 m)  Wt 106 lb (48.081 kg)  BMI 18.78 kg/m2  SpO2 98%  Discharge Labs:  Results for orders placed or performed during the hospital encounter of 04/16/16 (from the past 24 hour(s))  TSH     Status: Abnormal   Collection Time: 04/16/16  6:50 PM  Result Value Ref Range   TSH 9.139 (H) 0.350 - 4.500 uIU/mL  Vitamin B12     Status: None   Collection Time: 04/16/16  6:50 PM  Result Value Ref Range   Vitamin B-12 910 180 - 914 pg/mL  Ferritin     Status: Abnormal  Collection Time: 04/16/16  6:50 PM  Result Value Ref Range   Ferritin 667 (H) 11 - 307 ng/mL  Basic metabolic panel     Status: Abnormal   Collection Time: 04/17/16  5:20 AM  Result Value Ref Range   Sodium 140 135 - 145 mmol/L   Potassium 3.6 3.5 - 5.1 mmol/L   Chloride 109 101 - 111 mmol/L   CO2 24 22 - 32 mmol/L   Glucose, Bld 96 65 - 99 mg/dL   BUN 14 6 - 20 mg/dL   Creatinine, Ser 1.61 0.44 - 1.00 mg/dL   Calcium 7.8 (L) 8.9 - 10.3 mg/dL   GFR calc non Af Amer >60 >60 mL/min   GFR calc Af Amer >60 >60 mL/min   Anion gap 7 5 - 15    Signed: Fuller Plan, MD 04/17/2016, 3:28 PM    Services Ordered on Discharge: SNF Equipment Ordered on Discharge: none

## 2016-04-17 NOTE — Clinical Social Work Note (Signed)
Clinical Social Work Assessment  Patient Details  Name: Deanna Cross MRN: [id number] Date of Birth: 03-Sep-1926  Date of referral:  04/17/16               Reason for consult:  Facility Placement                Permission sought to share information with:  Facility Sport and exercise psychologist, Family Supports Permission granted to share information::  Yes, Verbal Permission Granted  Name::     Holiday representative::  SNFs  Relationship::  friend  Contact Information:  [phone]  Housing/Transportation Living arrangements for the past 2 months:  Single Family Home Source of Information:  Patient, Friend/Neighbor Patient Interpreter Needed:  None Criminal Activity/Legal Involvement Pertinent to Current Situation/Hospitalization:  No - Comment as needed Significant Relationships:  Friend Lives with:  Self Do you feel safe going back to the place where you live?  No Need for family participation in patient care:  Yes (Comment)  Care giving concerns:  CSW received referral for possible SNF placement at time of discharge. CSW met with patient regarding PT recommendation of SNF placement at time of discharge. Patient reports that she lives alone and  is currently unable to care for herself at home given patient's current physical needs and fall risk. Patient has no family in the [place]. Patient has friends that help take care of her. Patient expressed understanding of PT recommendation and is agreeable to SNF placement at time of discharge. CSW to continue to follow and assist with discharge planning needs.   Social Worker assessment / plan:  CSW spoke with patient concerning possibility of rehab at [facility] before returning home.  Employment status:  Retired Nurse, adult PT Recommendations:  [place] / Referral to community resources:  [place]  Patient/Family's Response to care:  Patient recognizes need for rehab before  returning home and is agreeable to a SNF in [place]. CSW also spoke with [place], who will complete patient's paperwork and come and pick her up.  Patient/Family's Understanding of and Emotional Response to Diagnosis, Current Treatment, and Prognosis:  Patient is realistic regarding therapy needs. No questions/concerns about plan or treatment.    Emotional Assessment Appearance:  Appears stated age Attitude/Demeanor/Rapport:  Other (Appropriate) Affect (typically observed):  Accepting, Appropriate, Pleasant Orientation:  Oriented to Situation, Oriented to  Time, Oriented to Place, Oriented to Self Alcohol / Substance use:  Not Applicable Psych involvement (Current and /or in the community):  No (Comment)  Discharge Needs  Concerns to be addressed:  Care Coordination Readmission within the last 30 days:  No Current discharge risk:  None Barriers to Discharge:  No Barriers Identified   Benard Halsted, [place] 04/17/2016, 3:07 PM

## 2016-04-17 NOTE — Care Management Note (Signed)
Case Management Note  Patient Details  Name: Joline Maxcyomi Hogate MRN: [id number] Date of Birth: Mar 04, 1926  Subjective/Objective:                 Spoke with patient in room. She states she has had several hospitalizations recently, 82 year old female who lives at home alone, all her family is in [place]. Patient states she has a walker, and is driven by friends and church firends to [place] apts etc. Patient admitted with weakness.   Action/Plan:  Will follow for DC planning, CSW [place] updated.  Expected Discharge Date:  04/17/16               Expected Discharge Plan:  Skilled Nursing Facility  In-House Referral:     Discharge planning Services  CM Consult  Post Acute Care Choice:    Choice offered to:     DME Arranged:    DME Agency:     HH Arranged:    HH Agency:     Status of Service:  In process, will continue to follow  Medicare Important Message Given:    Date Medicare IM Given:    Medicare IM give by:    Date Additional Medicare IM Given:    Additional Medicare Important Message give by:     If discussed at Long Length of Stay Meetings, dates discussed:    Additional Comments:  Lawerance SabalDebbie Shonita Rinck, RN 04/17/2016, 9:04 AM

## 2016-04-17 NOTE — NC FL2 (Signed)
[place] MEDICAID FL2 LEVEL OF CARE SCREENING TOOL     IDENTIFICATION  Patient Name: Deanna Cross Birthdate: [birth year] Sex: female Admission Date (Current Location): 04/16/2016  [facility] [place] [facility] [place] Number:  Producer, television/film/video and Address:  [facility] [place]. [facility], [address], [place], [place] [address]      Provider Number: [id number]  Attending Physician Name and Address:  Inez Catalina, MD  Relative Name and Phone Number:       Current Level of Care: Hospital Recommended Level of Care: Skilled Nursing Facility Prior Approval Number:    Date Approved/Denied:   PASRR Number: [id number]  Discharge Plan: SNF    Current Diagnoses: Patient Active Problem List   Diagnosis Date Noted  . Generalized weakness 04/16/2016  . TIA (transient ischemic attack) 04/16/2016  . Leg weakness, bilateral 04/16/2016  . Orthostatic hypotension 04/16/2016  . Hypothyroidism 04/16/2016  . History of CVA (cerebrovascular accident) 04/16/2016  . Anemia 04/16/2016  . AKI (acute kidney injury) (HCC) 04/16/2016  . Anxiety 04/16/2016  . Insomnia 04/16/2016    Orientation RESPIRATION BLADDER Height & Weight     Self, Time, Situation, Place  Normal Continent Weight: 48.081 kg (106 lb) Height:   (160 cm)  BEHAVIORAL SYMPTOMS/MOOD NEUROLOGICAL BOWEL NUTRITION STATUS      Continent Diet (please see DC Summary)  AMBULATORY STATUS COMMUNICATION OF NEEDS Skin   Supervision Verbally Normal                       Personal Care Assistance Level of Assistance  Bathing, Feeding, Dressing Bathing Assistance: Limited assistance Feeding assistance: Independent Dressing Assistance: Limited assistance     Functional Limitations Info             SPECIAL CARE FACTORS FREQUENCY  PT (By licensed PT)     PT Frequency: min 3x/week              Contractures      Additional Factors Info  Code Status, Allergies Code Status Info: Full Allergies Info: Kenalog,  Rocephin, Accolate, Aleve, Benadryl, Ibuprofen, Macrolides And Ketolides           Current Medications (04/17/2016):  This is the current hospital active medication list Current Facility-Administered Medications  Medication Dose Route Frequency Provider Last Rate Last Dose  . acetaminophen (TYLENOL) tablet 650 mg  650 mg Oral Q6H PRN Denton Brick, MD       Or  . acetaminophen (TYLENOL) suppository 650 mg  650 mg Rectal Q6H PRN Denton Brick, MD      . albuterol (PROVENTIL) (2.5 MG/3ML) 0.083% nebulizer solution 2.5 mg  2.5 mg Nebulization Q6H PRN Inez Catalina, MD      . aspirin EC tablet 81 mg  81 mg Oral Daily Denton Brick, MD   81 mg at 04/17/16 0904  . citalopram (CELEXA) tablet 20 mg  20 mg Oral Daily Denton Brick, MD   20 mg at 04/17/16 0906  . docusate sodium (COLACE) capsule 100 mg  100 mg Oral q morning - 10a Denton Brick, MD   100 mg at 04/17/16 0904  . enoxaparin (LOVENOX) injection 30 mg  30 mg Subcutaneous Q24H Sherron Monday, RPH   30 mg at 04/17/16 5621  . [START ON 04/18/2016] levothyroxine (SYNTHROID, LEVOTHROID) tablet 88 mcg  88 mcg Oral QAC breakfast Ruben Im, MD      . mometasone-formoterol [facility]) 200-5 MCG/ACT inhaler 2 puff  2 puff Inhalation BID Denton Brickiana M Truong, MD   2 puff at 04/16/16 2317  . traZODone (DESYREL) tablet 50 mg  50 mg Oral QHS PRN Denton Brickiana M Truong, MD   50 mg at 04/16/16 2339     Discharge Medications: Please see discharge summary for a list of discharge medications.  Relevant Imaging Results:  Relevant Lab Results:   Additional Information SSN: [id number]  Celinda Dethlefs S Forest Home[place], [place]

## 2016-04-17 NOTE — Progress Notes (Addendum)
Nsg Discharge Note  Admit Date:  04/16/2016 Discharge date: 04/17/2016   Jaedin Wilcoxen to be D/C'd Rehab per MD order.  AVS completed.  Copy for chart, and copy for patient signed, and dated. Patient/caregiver able to verbalize understanding.  Discharge Medication:   Medication List    STOP taking these medications        levofloxacin 750 MG tablet  Commonly known as:  LEVAQUIN      TAKE these medications        ADVAIR DISKUS 250-50 MCG/DOSE Aepb  Generic drug:  Fluticasone-Salmeterol  Inhale 1 puff into the lungs 2 (two) times daily.     aspirin EC 81 MG tablet  Take 81 mg by mouth daily.     CALTRATE 600+D 600-800 MG-UNIT Tabs  Generic drug:  Calcium Carb-Cholecalciferol  Take 1 tablet by mouth daily.     citalopram 20 MG tablet  Commonly known as:  CELEXA  Take 20 mg by mouth daily.     docusate sodium 100 MG capsule  Commonly known as:  COLACE  Take 100 mg by mouth every morning.     levothyroxine 88 MCG tablet  Commonly known as:  SYNTHROID, LEVOTHROID  Take 1 tablet (88 mcg total) by mouth daily before breakfast.  Start taking on:  04/18/2016     mineral/vitamin supplement 70 MG Tabs tablet  Take 1 tablet by mouth daily.     omeprazole 20 MG capsule  Commonly known as:  PRILOSEC  Take 20 mg by mouth 2 (two) times daily.     pravastatin 20 MG tablet  Commonly known as:  PRAVACHOL  Take 20 mg by mouth every evening.     PROAIR HFA 108 (90 Base) MCG/ACT inhaler  Generic drug:  albuterol  Inhale 2 puffs into the lungs every 6 (six) hours as needed for wheezing or shortness of breath.     raloxifene 60 MG tablet  Commonly known as:  EVISTA  Take 60 mg by mouth daily.     traZODone 50 MG tablet  Commonly known as:  DESYREL  Take 50 mg by mouth at bedtime as needed for sleep.        Discharge Assessment: Filed Vitals:   04/17/16 0554 04/17/16 1418  BP: 148/45 94/28  Pulse: 67 73  Temp: 97.9 F (36.6 C) 98 F (36.7 C)  Resp: 18 18   Skin clean, dry  and intact without evidence of skin break down, no evidence of skin tears noted. IV catheter discontinued intact. Site without signs and symptoms of complications - no redness or edema noted at insertion site, patient denies c/o pain - only slight tenderness at site.  Dressing with slight pressure applied.  D/c Instructions-Education: Discharge instructions given to patient/family with verbalized understanding. D/c education completed with patient/family including follow up instructions, medication list, d/c activities limitations if indicated, with other d/c instructions as indicated by MD - patient able to verbalize understanding, all questions fully answered. Patient instructed to return to ED, call 911, or call MD for any changes in condition.  [facility]. [facility]  Camillo FlamingVicki L Suhas Estis, RN 04/17/2016 4:44 PM

## 2016-04-17 NOTE — Progress Notes (Signed)
[facility] (Report: [phone]) Anticipated DC date: 04/17/16 Family notified: [facility] by: car   Per MD [facility]. RN, patient, patient's family, and facility notified of DC. RN given number for report.  CSW signing off.  Cristobal GoldmannNadia Lennyx Verdell, [place] Clinical Social Worker [phone]

## 2016-04-17 NOTE — Evaluation (Signed)
Physical Therapy Evaluation Patient Details Name: Deanna Cross MRN: [id number] DOB: Jan 29, 1926 Today's Date: 04/17/2016   History of Present Illness  Pt adm with BLE weakness. Pt with orthostatic hypotension. PMH - bil hip fx's, CVA, anemia, anxiety  Clinical Impression  Pt admitted with above diagnosis and presents to PT with functional limitations due to deficits listed below (See PT problem list). Pt needs skilled PT to maximize independence and safety to allow discharge to ST-SNF prior to return home alone.     Follow Up Recommendations SNF    Equipment Recommendations  None recommended by PT    Recommendations for Other Services       Precautions / Restrictions Precautions Precautions: Fall      Mobility  Bed Mobility               General bed mobility comments: Pt up in chair  Transfers Overall transfer level: Needs assistance Equipment used: 4-wheeled walker Transfers: Sit to/from Stand Sit to Stand: Min guard         General transfer comment: For balance and safety  Ambulation/Gait Ambulation/Gait assistance: Min guard Ambulation Distance (Feet): 200 Feet Assistive device: 4-wheeled walker Gait Pattern/deviations: Step-through pattern;Decreased stride length     General Gait Details: Slightly unsteady with legs fatiguing  Stairs            Wheelchair Mobility    Modified Rankin (Stroke Patients Only)       Balance Overall balance assessment: Needs assistance;History of Falls Sitting-balance support: No upper extremity supported;Feet supported Sitting balance-Leahy Scale: Normal     Standing balance support: No upper extremity supported;During functional activity Standing balance-Leahy Scale: Fair                               Pertinent Vitals/Pain Pain Assessment: No/denies pain    Home Living Family/patient expects to be discharged to:: Private residence Living Arrangements: Alone Available Help at Discharge:  Neighbor Type of Home: House Home Access: Stairs to enter Entrance Stairs-Rails: Right Entrance Stairs-Number of Steps: 2 Home Layout: One level Home Equipment: Environmental consultantWalker - 4 wheels      Prior Function Level of Independence: Independent with assistive device(s)               Hand Dominance        Extremity/Trunk Assessment   Upper Extremity Assessment: Generalized weakness           Lower Extremity Assessment: Generalized weakness         Communication   Communication: No difficulties  Cognition Arousal/Alertness: Awake/alert Behavior During Therapy: WFL for tasks assessed/performed Overall Cognitive Status: Within Functional Limits for tasks assessed                      General Comments      Exercises        Assessment/Plan    PT Assessment Patient needs continued PT services  PT Diagnosis Difficulty walking;Generalized weakness   PT Problem List Decreased strength;Decreased activity tolerance;Decreased balance;Decreased mobility  PT Treatment Interventions DME instruction;Gait training;Functional mobility training;Therapeutic activities;Therapeutic exercise;Balance training;Patient/family education   PT Goals (Current goals can be found in the Care Plan section) Acute Rehab PT Goals Patient Stated Goal: get stronger PT Goal Formulation: With patient Time For Goal Achievement: 04/24/16 Potential to Achieve Goals: Good    Frequency Min 3X/week   Barriers to discharge Decreased caregiver support Lives alone. No family  Co-evaluation               End of Session   Activity Tolerance: Patient tolerated treatment well Patient left: in chair;with call bell/phone within reach;with chair alarm set Nurse Communication: Mobility status    Functional Assessment Tool Used: clinical judgement Functional Limitation: Mobility: Walking and moving around Mobility: Walking and Moving Around Current Status (O9629): At least 20 percent but  less than 40 percent impaired, limited or restricted Mobility: Walking and Moving Around Goal Status [phone]): At least 1 percent but less than 20 percent impaired, limited or restricted    Time: 1224-1238 PT Time Calculation (min) (ACUTE ONLY): 14 min   Charges:   PT Evaluation $PT Eval Moderate Complexity: 1 Procedure     PT G Codes:   PT G-Codes **NOT FOR INPATIENT CLASS** Functional Assessment Tool Used: clinical judgement Functional Limitation: Mobility: Walking and moving around Mobility: Walking and Moving Around Current Status (L2440): At least 20 percent but less than 40 percent impaired, limited or restricted Mobility: Walking and Moving Around Goal Status [phone]): At least 1 percent but less than 20 percent impaired, limited or restricted    [facility] 04/17/2016, 1:15 PM [facility] PT [phone]

## 2016-04-17 NOTE — Progress Notes (Signed)
Subjective: Feels much improved today and has no LE weakness. Has walked in room with no problems.   Exam: Filed Vitals:   04/16/16 2208 04/17/16 0554  BP: 168/51 148/45  Pulse: 74 67  Temp: 98 F (36.7 C) 97.9 F (36.6 C)  Resp: 19 18        Gen: In bed, NAD MS: Alert and oriented CN: 2-12 intact Motor: MAEW with good strength. No give way weakness noted.  Sensory: intact throughout   Pertinent Labs/Diagnostics: none  Felicie MornDavid Smith PA-C [facility] [phone]  Impression: 82 YO female with bilateral LE weakness which has improved with rehydration. No further episodes or complaints this AM. No significant orthostasis noted when orthostatic BP obtained.    Recommendations: 1) no further recommendations at this time. Neurology will S/O    04/17/2016, 9:18 AM

## 2016-04-17 NOTE — Care Management Obs Status (Signed)
MEDICARE OBSERVATION STATUS NOTIFICATION   Patient Details  Name: Deanna Cross MRN: [id number] Date of Birth: 05-02-1926   Medicare Observation Status Notification Given:  Yes    Lawerance Sabalebbie Dredyn Gubbels, RN 04/17/2016, 9:04 AM

## 2016-04-17 NOTE — Progress Notes (Signed)
Date: 04/17/2016  Patient name: Giselle Brutus  Medical record number: [id number]  Date of birth: [birth year]   I have seen and evaluated Emerald Jeffries and discussed their care with the Residency Team. Briefly, Ms. 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  She reports that on the day of admission, she was not able to rise from her chair and she had some dizziness, described as the room spinning.  She called her neighbor who reported slurred speech on the phone and when they arrived, she was tremulous and slumped over in the chair.  EMS noted repetitive speech, but this improved during transport.  She previously was able to ambulate at home.  She has recently been started on Levaquin for a URI (had allergic reaction to ceftriaxone/kenalog given in clinic recently) and she has also been started on lisinopril recently.  Further symptoms include a cough which is improving.  She has recently been hospitalized due to depression and SI.  It was during this admission that she was diagnosed with HTN and given lisinopril.  In the ED, they did evaluate her for stroke with CT head and MRI brain which did not show an acute stroke.  She was started on medications and Neurology evaluated her.    This morning, after fluids, she is reporting feeling much stronger.    PMHx, Fam Hx, and/or Soc Hx : Never smoker, widowed.   Filed Vitals:   04/16/16 2208 04/17/16 0554  BP: 168/51 148/45  Pulse: 74 67  Temp: 98 F (36.7 C) 97.9 F (36.6 C)  Resp: 19 18   Physical Exam Gen: Small woman, thin, no acute distress HENT: Neck Supple, NCAT Eyes: Anicteric sclerae CV: RR, NR, no murmur Pulm: CTAB, no wheezes Abd: Soft, +BS Neuro: Facial droop improved, barely noticeable.  4/5 strength in RLE.  Otherwise neuro exam is nonfocal  Pertinent data Cr 1.00, BUN 22 Alb 3.2 Hgb 10.9  UDS + for  opiates (reports not taking recently)  CT head as read by Dr. Grace Isaac IMPRESSION: Similar findings of advanced atrophy and mild microvascular ischemic disease without acute intracranial process.  MRI/MRA brain as read by Dr. Constance Goltz MRI HEAD No acute infarct or intracranial hemorrhage. Remote small mid right coronal radiata infarct. Remote tiny right cerebellar infarct. Mild to slightly moderate chronic microvascular changes. Mild global atrophy without hydrocephalus. No intracranial mass lesion noted on this unenhanced exam. MRA HEAD Exam is motion degraded. This limits evaluation for evaluating intracranial atherosclerotic type changes and for detecting aneurysm. All that can be stated with certainty on the present examination is that there is flow within portions of the internal carotid arteries, vertebral arteries and basilar artery. Findings are suspicious for intracranial atherosclerotic changes with medium and small size vessel stenosis.  Assessment and Plan: I have seen and evaluated the patient as outlined above. I agree with the formulated Assessment and Plan as detailed in the residents' note, with the following changes:   1. Bilateral LE weakness, dizziness - Likely related to new medications (polypharmacy) and possibly orthostasis (checked in the ED, negative, but she was getting fluids) - Low rate hydration overnight - Recheck BMET - Hold lisinopril - D/C levaquin - If she continues to have BP issues, given her history of stroke and likely atherosclerosis, would favor a medication which will not cause orthostasis - encourage adequate PO intake at home - Neurology following.   -  Telemetry - B12 and TSH were checked, TSH high, increase synthroid modestly  2. AKI - Resolving - Hold lisinopril, consider very low dose amlodipine if needed  3.  Normocytic anemia - Ferritin level checked, B12 checked - Follow up outpatient  Needs PT evaluation for safety/strength assessment.        Inez CatalinaEmily B Nyima Vanacker, MD 6/9/20171:59 PM

## 2016-10-26 DIAGNOSIS — N179 Acute kidney failure, unspecified: Secondary | ICD-10-CM

## 2016-10-26 DIAGNOSIS — E785 Hyperlipidemia, unspecified: Secondary | ICD-10-CM

## 2016-10-26 DIAGNOSIS — D649 Anemia, unspecified: Secondary | ICD-10-CM

## 2016-10-26 DIAGNOSIS — M810 Age-related osteoporosis without current pathological fracture: Secondary | ICD-10-CM

## 2016-10-26 DIAGNOSIS — E039 Hypothyroidism, unspecified: Secondary | ICD-10-CM

## 2016-10-26 DIAGNOSIS — F329 Major depressive disorder, single episode, unspecified: Secondary | ICD-10-CM

## 2016-10-26 DIAGNOSIS — I10 Essential (primary) hypertension: Secondary | ICD-10-CM

## 2016-10-26 DIAGNOSIS — S32512A Fracture of superior rim of left pubis, initial encounter for closed fracture: Secondary | ICD-10-CM

## 2016-10-26 DIAGNOSIS — S0083XA Contusion of other part of head, initial encounter: Secondary | ICD-10-CM

## 2016-10-26 DIAGNOSIS — W19XXXA Unspecified fall, initial encounter: Secondary | ICD-10-CM

## 2016-10-26 DIAGNOSIS — K219 Gastro-esophageal reflux disease without esophagitis: Secondary | ICD-10-CM

## 2016-10-26 DIAGNOSIS — F0390 Unspecified dementia without behavioral disturbance: Secondary | ICD-10-CM

## 2016-10-26 DIAGNOSIS — M6282 Rhabdomyolysis: Secondary | ICD-10-CM

## 2016-10-27 DIAGNOSIS — E039 Hypothyroidism, unspecified: Secondary | ICD-10-CM

## 2016-10-27 DIAGNOSIS — E785 Hyperlipidemia, unspecified: Secondary | ICD-10-CM

## 2016-10-27 DIAGNOSIS — K219 Gastro-esophageal reflux disease without esophagitis: Secondary | ICD-10-CM

## 2016-10-27 DIAGNOSIS — D649 Anemia, unspecified: Secondary | ICD-10-CM

## 2016-10-27 DIAGNOSIS — S0083XA Contusion of other part of head, initial encounter: Secondary | ICD-10-CM

## 2016-10-27 DIAGNOSIS — N179 Acute kidney failure, unspecified: Secondary | ICD-10-CM

## 2016-10-27 DIAGNOSIS — W19XXXA Unspecified fall, initial encounter: Secondary | ICD-10-CM

## 2016-10-27 DIAGNOSIS — I10 Essential (primary) hypertension: Secondary | ICD-10-CM | POA: Diagnosis not present

## 2016-10-27 DIAGNOSIS — M6282 Rhabdomyolysis: Secondary | ICD-10-CM

## 2016-10-27 DIAGNOSIS — F329 Major depressive disorder, single episode, unspecified: Secondary | ICD-10-CM

## 2016-10-27 DIAGNOSIS — F0390 Unspecified dementia without behavioral disturbance: Secondary | ICD-10-CM

## 2016-10-27 DIAGNOSIS — M810 Age-related osteoporosis without current pathological fracture: Secondary | ICD-10-CM

## 2016-10-27 DIAGNOSIS — S32512A Fracture of superior rim of left pubis, initial encounter for closed fracture: Secondary | ICD-10-CM

## 2016-10-28 DIAGNOSIS — I10 Essential (primary) hypertension: Secondary | ICD-10-CM | POA: Diagnosis not present

## 2016-10-28 DIAGNOSIS — N179 Acute kidney failure, unspecified: Secondary | ICD-10-CM | POA: Diagnosis not present

## 2016-10-28 DIAGNOSIS — E785 Hyperlipidemia, unspecified: Secondary | ICD-10-CM | POA: Diagnosis not present

## 2016-10-28 DIAGNOSIS — M6282 Rhabdomyolysis: Secondary | ICD-10-CM | POA: Diagnosis not present

## 2016-10-29 DIAGNOSIS — I10 Essential (primary) hypertension: Secondary | ICD-10-CM | POA: Diagnosis not present

## 2016-10-29 DIAGNOSIS — N179 Acute kidney failure, unspecified: Secondary | ICD-10-CM | POA: Diagnosis not present

## 2016-10-29 DIAGNOSIS — M6282 Rhabdomyolysis: Secondary | ICD-10-CM | POA: Diagnosis not present

## 2016-10-29 DIAGNOSIS — E785 Hyperlipidemia, unspecified: Secondary | ICD-10-CM | POA: Diagnosis not present

## 2017-01-05 DIAGNOSIS — F0390 Unspecified dementia without behavioral disturbance: Secondary | ICD-10-CM | POA: Diagnosis not present

## 2017-01-05 DIAGNOSIS — R911 Solitary pulmonary nodule: Secondary | ICD-10-CM | POA: Diagnosis not present

## 2017-01-05 DIAGNOSIS — K219 Gastro-esophageal reflux disease without esophagitis: Secondary | ICD-10-CM | POA: Diagnosis not present

## 2017-01-05 DIAGNOSIS — J441 Chronic obstructive pulmonary disease with (acute) exacerbation: Secondary | ICD-10-CM

## 2017-01-05 DIAGNOSIS — E785 Hyperlipidemia, unspecified: Secondary | ICD-10-CM | POA: Diagnosis not present

## 2017-01-05 DIAGNOSIS — J101 Influenza due to other identified influenza virus with other respiratory manifestations: Secondary | ICD-10-CM | POA: Diagnosis not present

## 2017-01-05 DIAGNOSIS — S3210XA Unspecified fracture of sacrum, initial encounter for closed fracture: Secondary | ICD-10-CM | POA: Diagnosis not present

## 2017-01-05 DIAGNOSIS — E039 Hypothyroidism, unspecified: Secondary | ICD-10-CM | POA: Diagnosis not present

## 2017-01-05 DIAGNOSIS — G92 Toxic encephalopathy: Secondary | ICD-10-CM

## 2017-01-05 DIAGNOSIS — J9601 Acute respiratory failure with hypoxia: Secondary | ICD-10-CM

## 2017-01-05 DIAGNOSIS — I10 Essential (primary) hypertension: Secondary | ICD-10-CM | POA: Diagnosis not present

## 2017-01-05 DIAGNOSIS — E872 Acidosis: Secondary | ICD-10-CM

## 2017-01-05 DIAGNOSIS — M810 Age-related osteoporosis without current pathological fracture: Secondary | ICD-10-CM

## 2017-01-05 DIAGNOSIS — S32512A Fracture of superior rim of left pubis, initial encounter for closed fracture: Secondary | ICD-10-CM | POA: Diagnosis not present

## 2017-01-05 DIAGNOSIS — F329 Major depressive disorder, single episode, unspecified: Secondary | ICD-10-CM | POA: Diagnosis not present

## 2017-01-06 DIAGNOSIS — J441 Chronic obstructive pulmonary disease with (acute) exacerbation: Secondary | ICD-10-CM | POA: Diagnosis not present

## 2017-01-06 DIAGNOSIS — J9601 Acute respiratory failure with hypoxia: Secondary | ICD-10-CM | POA: Diagnosis not present

## 2017-01-06 DIAGNOSIS — J101 Influenza due to other identified influenza virus with other respiratory manifestations: Secondary | ICD-10-CM | POA: Diagnosis not present

## 2017-01-06 DIAGNOSIS — R911 Solitary pulmonary nodule: Secondary | ICD-10-CM | POA: Diagnosis not present

## 2017-01-07 DIAGNOSIS — J9601 Acute respiratory failure with hypoxia: Secondary | ICD-10-CM | POA: Diagnosis not present

## 2017-01-07 DIAGNOSIS — R911 Solitary pulmonary nodule: Secondary | ICD-10-CM | POA: Diagnosis not present

## 2017-01-07 DIAGNOSIS — J101 Influenza due to other identified influenza virus with other respiratory manifestations: Secondary | ICD-10-CM | POA: Diagnosis not present

## 2017-01-07 DIAGNOSIS — J441 Chronic obstructive pulmonary disease with (acute) exacerbation: Secondary | ICD-10-CM | POA: Diagnosis not present

## 2017-01-08 DIAGNOSIS — J101 Influenza due to other identified influenza virus with other respiratory manifestations: Secondary | ICD-10-CM | POA: Diagnosis not present

## 2017-01-08 DIAGNOSIS — R911 Solitary pulmonary nodule: Secondary | ICD-10-CM | POA: Diagnosis not present

## 2017-01-08 DIAGNOSIS — J441 Chronic obstructive pulmonary disease with (acute) exacerbation: Secondary | ICD-10-CM | POA: Diagnosis not present

## 2017-01-08 DIAGNOSIS — J9601 Acute respiratory failure with hypoxia: Secondary | ICD-10-CM | POA: Diagnosis not present

## 2017-08-10 IMAGING — MR MR HEAD W/O CM
9 of 11 series · 32 of 48 positions shown · non-contrast
Comparison: 04/16/2016 head CT.

CLINICAL DATA: 89-year-old female with depression and altered
mental status. Weakness. Syncopal episode 1 week ago. Subsequent
encounter.

EXAM:
MRI HEAD WITHOUT CONTRAST
MRA HEAD WITHOUT CONTRAST
TECHNIQUE: Multiplanar, multiecho pulse sequences of the brain and surrounding
structures were obtained without intravenous contrast. Angiographic
images of the head were obtained using MRA technique without
contrast.

[Series 3: T1 · sagittal · 5.0mm · 0.47mm/px · 1 of 23 slices shown]
[im 1/23]
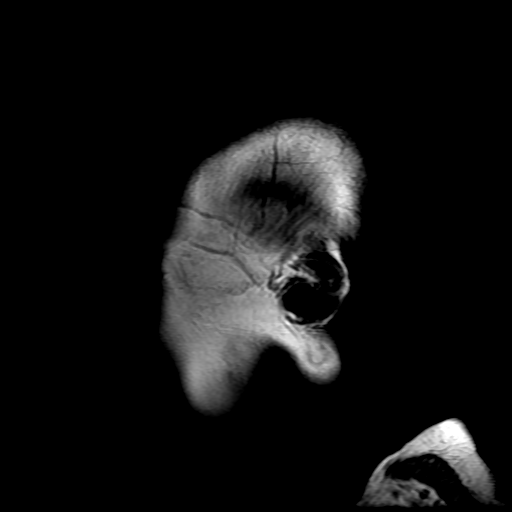

[Series 4: DWI · axial · 3.0mm · 1.09mm/px · z∈[-84,+51]mm · 8 of 94 slices shown (1 of 4)]
[im 1/94]
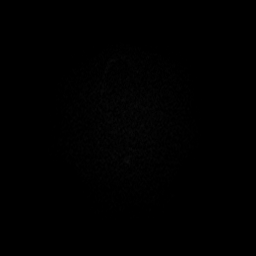
[im 14/94]
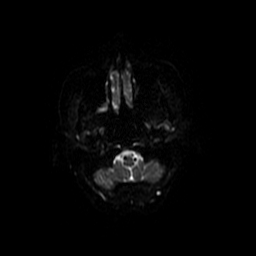
[im 27/94]
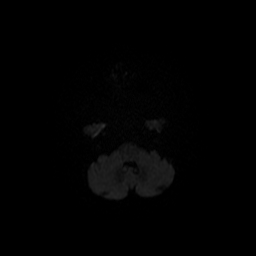
[im 40/94]
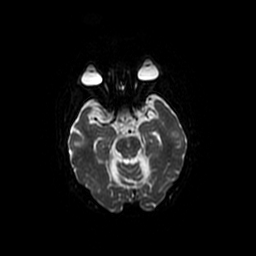
[im 54/94]
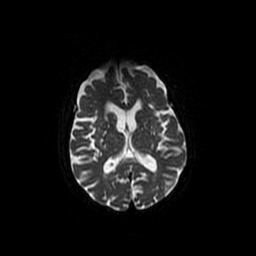
[im 67/94]
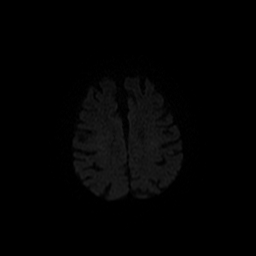
[im 80/94]
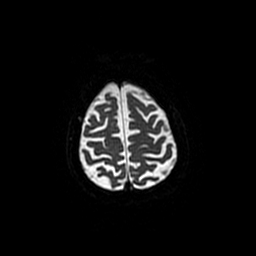
[im 94/94]
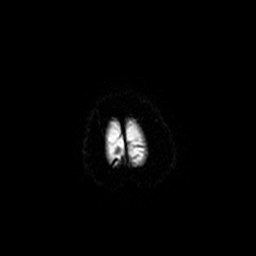

[Series 5: (id) mt fs · axial · 1.4mm · 0.43mm/px · z∈[-75,-20]mm · 5 of 136 slices shown]
[im 1/136]
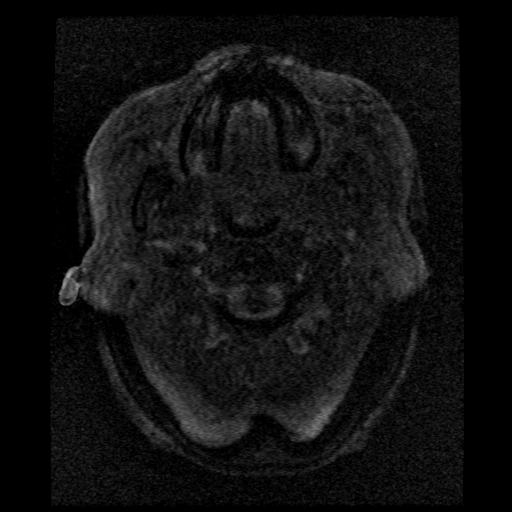
[im 28/136]
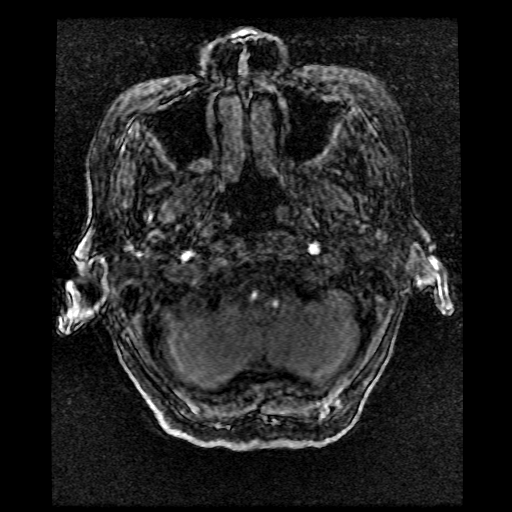
[im 41/136]
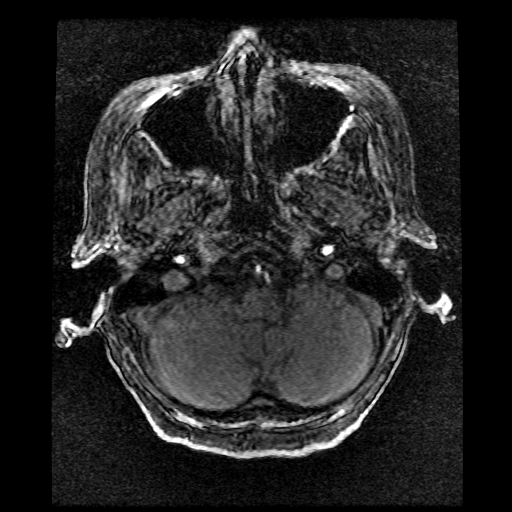
[im 55/136]
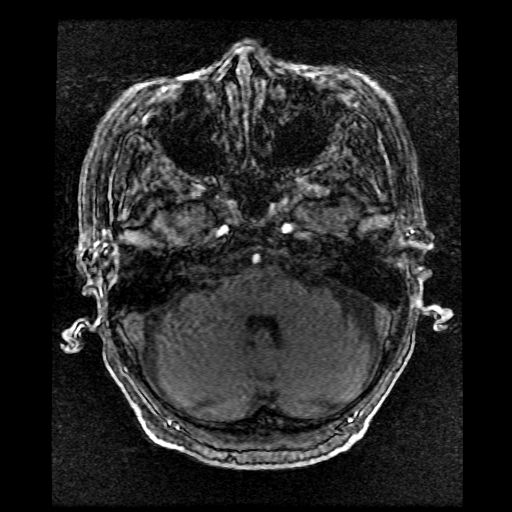
[im 82/136]
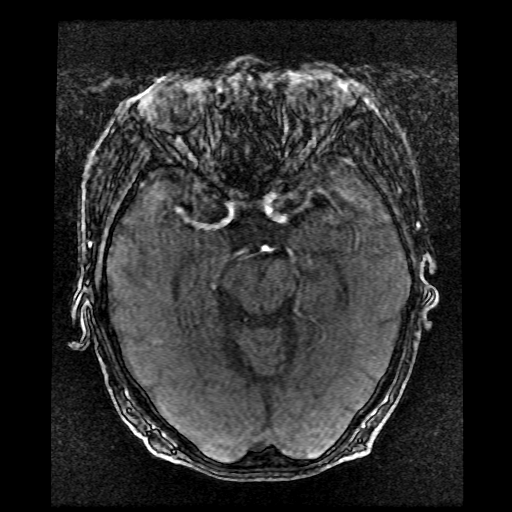

[Series 6: DWI · coronal · 5.0mm · 1.09mm/px · 5 of 66 slices shown (2 of 4)]
[im 1/66]
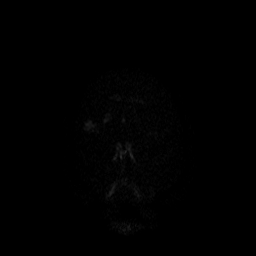
[im 17/66]
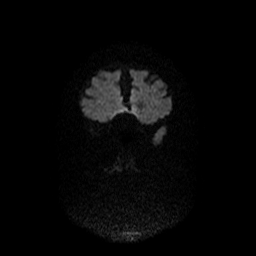
[im 33/66]
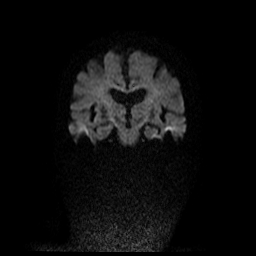
[im 49/66]
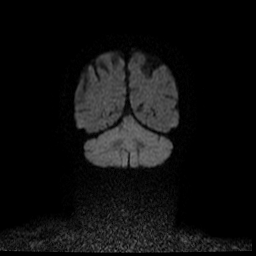
[im 66/66]
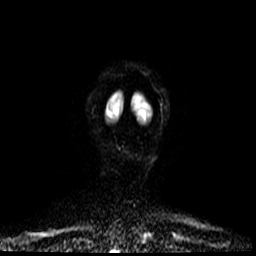

[Series 7: T2 · axial · 5.0mm · 0.43mm/px · z∈[-79,+57]mm · 2 of 24 slices shown (1 of 2)]
[im 1/24]
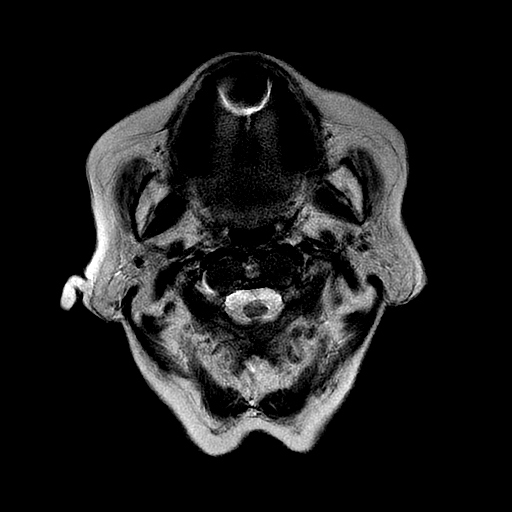
[im 24/24]
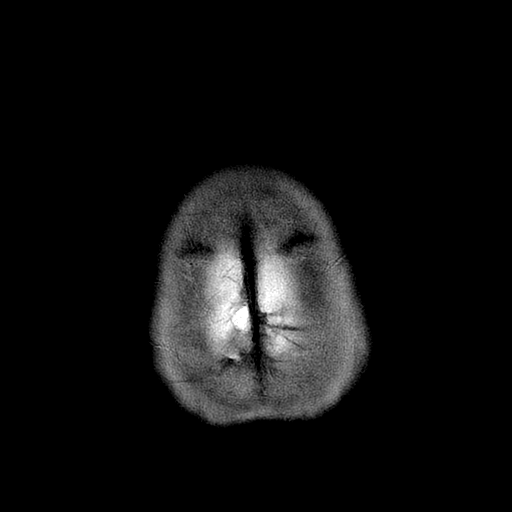

[Series 8: FLAIR · axial · 5.0mm · 0.43mm/px · z∈[-79,+57]mm · 2 of 24 slices shown]
[im 1/24]
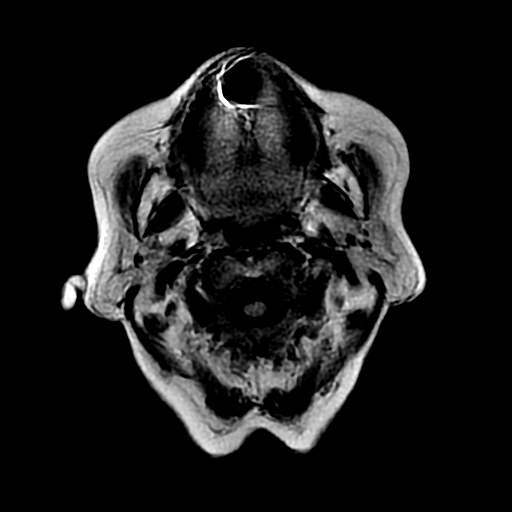
[im 24/24]
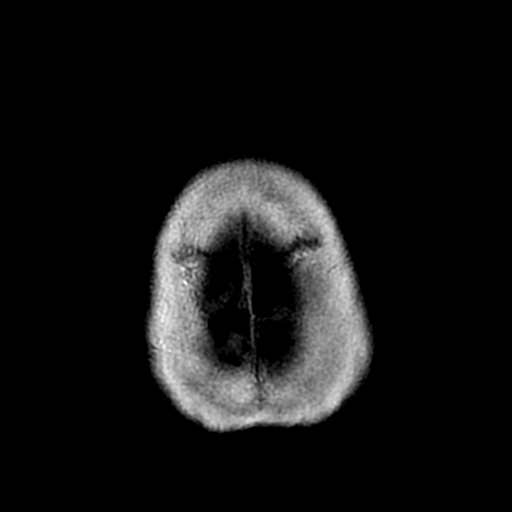

[Series 11: T2 · coronal · 5.0mm · 0.43mm/px · 2 of 28 slices shown (2 of 2)]
[im 1/28]
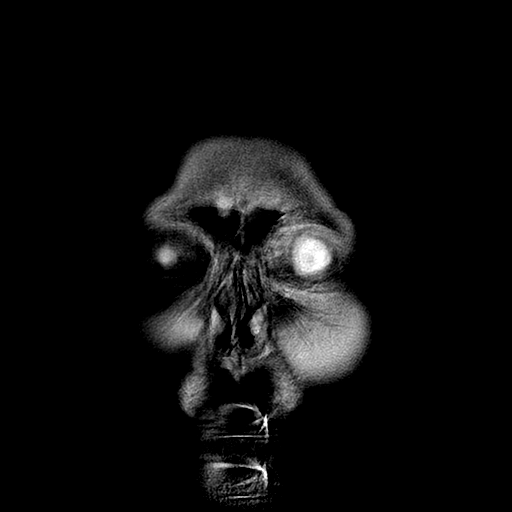
[im 28/28]
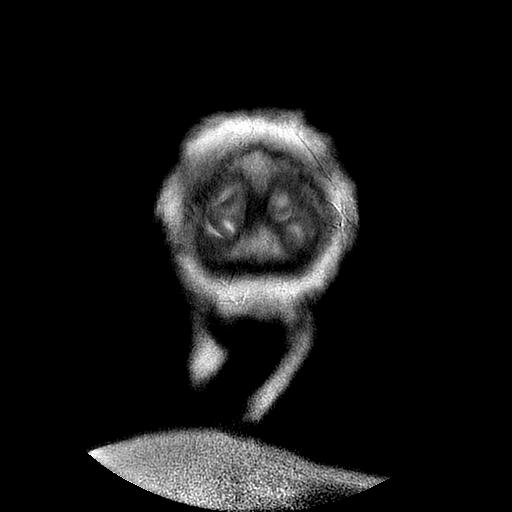

[Series 400: DWI · axial · 3.0mm · 1.09mm/px · z∈[-84,+51]mm · 4 of 47 slices shown (3 of 4)]
[im 1/47]
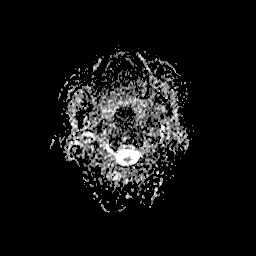
[im 16/47]
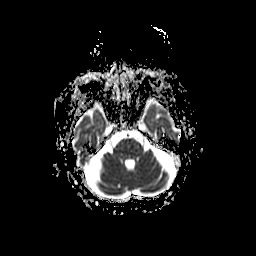
[im 31/47]
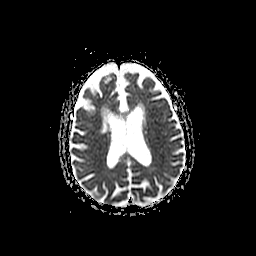
[im 47/47]
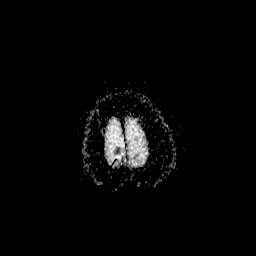

[Series 600: DWI · coronal · 5.0mm · 1.09mm/px · 3 of 33 slices shown (4 of 4)]
[im 1/33]
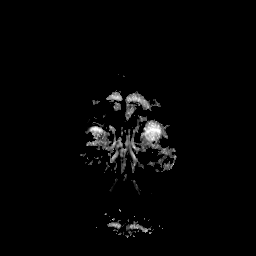
[im 17/33]
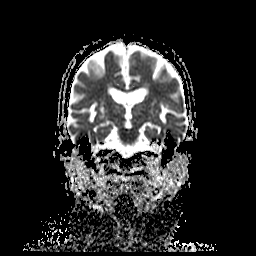
[im 33/33]
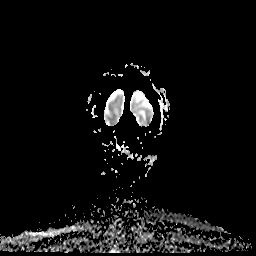

[32 of 48 positions shown; findings below may reference images not displayed]

FINDINGS: MRI HEAD FINDINGS

No acute infarct or intracranial hemorrhage.

Remote small mid right coronal radiata infarct. Remote tiny right
cerebellar infarct.

Mild to slightly moderate chronic microvascular changes.

Incidentally noted are prominent perivascular spaces in the basal
ganglia.

Mild global atrophy without hydrocephalus.

No intracranial mass lesion noted on this unenhanced exam.

Post lens replacement otherwise orbital structures unremarkable.

Cervical medullary junction within normal limits. Straightening of
the cervical spine with mild degenerative changes without high-grade
stenosis.

Partially empty non expanded sella incidentally noted.

Minimal mucosal thickening ethmoid sinus air cells, right maxillary
sinus and right sphenoid sinus.

MRA HEAD FINDINGS

Exam is motion degraded. This limits evaluation for evaluating
intracranial atherosclerotic type changes and for detecting
aneurysm.

All that can be stated with certainty on the present examination is
that there is flow within portions of the internal carotid arteries,
vertebral arteries and basilar artery.

Findings are suspicious for intracranial atherosclerotic changes
with medium and small size vessel stenosis.

Bulge left lateral aspect of the basilar tip may be related to
artifact/atherosclerotic type changes rather than aneurysm.
IMPRESSION: MRI HEAD

No acute infarct or intracranial hemorrhage.

Remote small mid right coronal radiata infarct. Remote tiny right
cerebellar infarct.

Mild to slightly moderate chronic microvascular changes.

Mild global atrophy without hydrocephalus.

No intracranial mass lesion noted on this unenhanced exam.

MRA HEAD

Exam is motion degraded. This limits evaluation for evaluating
intracranial atherosclerotic type changes and for detecting
aneurysm.

All that can be stated with certainty on the present examination is
that there is flow within portions of the internal carotid arteries,
vertebral arteries and basilar artery.

Findings are suspicious for intracranial atherosclerotic changes
with medium and small size vessel stenosis.

## 2019-01-05 DIAGNOSIS — J189 Pneumonia, unspecified organism: Secondary | ICD-10-CM

## 2019-01-05 DIAGNOSIS — J9691 Respiratory failure, unspecified with hypoxia: Secondary | ICD-10-CM

## 2019-01-06 DIAGNOSIS — J189 Pneumonia, unspecified organism: Secondary | ICD-10-CM | POA: Diagnosis not present

## 2019-01-06 DIAGNOSIS — J9691 Respiratory failure, unspecified with hypoxia: Secondary | ICD-10-CM | POA: Diagnosis not present

## 2019-01-07 DIAGNOSIS — J189 Pneumonia, unspecified organism: Secondary | ICD-10-CM | POA: Diagnosis not present

## 2019-01-07 DIAGNOSIS — J9691 Respiratory failure, unspecified with hypoxia: Secondary | ICD-10-CM | POA: Diagnosis not present

## 2019-01-08 DIAGNOSIS — J189 Pneumonia, unspecified organism: Secondary | ICD-10-CM | POA: Diagnosis not present

## 2019-01-08 DIAGNOSIS — J9691 Respiratory failure, unspecified with hypoxia: Secondary | ICD-10-CM | POA: Diagnosis not present

## 2019-01-09 DIAGNOSIS — J9691 Respiratory failure, unspecified with hypoxia: Secondary | ICD-10-CM | POA: Diagnosis not present

## 2019-01-09 DIAGNOSIS — J189 Pneumonia, unspecified organism: Secondary | ICD-10-CM | POA: Diagnosis not present

## 2019-01-10 DIAGNOSIS — J9691 Respiratory failure, unspecified with hypoxia: Secondary | ICD-10-CM | POA: Diagnosis not present

## 2019-01-10 DIAGNOSIS — J189 Pneumonia, unspecified organism: Secondary | ICD-10-CM | POA: Diagnosis not present

## 2019-01-11 DIAGNOSIS — J189 Pneumonia, unspecified organism: Secondary | ICD-10-CM | POA: Diagnosis not present

## 2019-01-11 DIAGNOSIS — J9691 Respiratory failure, unspecified with hypoxia: Secondary | ICD-10-CM | POA: Diagnosis not present

## 2019-03-10 DEATH — deceased
